# Patient Record
Sex: Female | Born: 1955 | Race: White | Hispanic: No | Marital: Married | Smoking: Former smoker
Health system: Southern US, Community
[De-identification: ages and names within clinical notes are randomized; demographics above are authoritative.]

## PROBLEM LIST (undated history)

## (undated) DIAGNOSIS — I10 Essential (primary) hypertension: Secondary | ICD-10-CM

## (undated) DIAGNOSIS — J45909 Unspecified asthma, uncomplicated: Secondary | ICD-10-CM

## (undated) DIAGNOSIS — I8393 Asymptomatic varicose veins of bilateral lower extremities: Secondary | ICD-10-CM

## (undated) DIAGNOSIS — K5900 Constipation, unspecified: Secondary | ICD-10-CM

## (undated) DIAGNOSIS — F32A Depression, unspecified: Secondary | ICD-10-CM

## (undated) DIAGNOSIS — T7840XA Allergy, unspecified, initial encounter: Secondary | ICD-10-CM

## (undated) DIAGNOSIS — I8290 Acute embolism and thrombosis of unspecified vein: Secondary | ICD-10-CM

## (undated) DIAGNOSIS — N83202 Unspecified ovarian cyst, left side: Secondary | ICD-10-CM

## (undated) DIAGNOSIS — K219 Gastro-esophageal reflux disease without esophagitis: Secondary | ICD-10-CM

## (undated) DIAGNOSIS — F419 Anxiety disorder, unspecified: Secondary | ICD-10-CM

## (undated) HISTORY — DX: Gastro-esophageal reflux disease without esophagitis: K21.9

## (undated) HISTORY — PX: TUBAL LIGATION: SHX77

## (undated) HISTORY — DX: Anxiety disorder, unspecified: F41.9

## (undated) HISTORY — DX: Allergy, unspecified, initial encounter: T78.40XA

## (undated) HISTORY — PX: AUGMENTATION MAMMAPLASTY: SUR837

## (undated) HISTORY — PX: OTHER SURGICAL HISTORY: SHX169

## (undated) HISTORY — PX: CHOLECYSTECTOMY: SHX55

## (undated) HISTORY — PX: BREAST EXCISIONAL BIOPSY: SUR124

---

## 1998-11-12 ENCOUNTER — Other Ambulatory Visit: Admission: RE | Admit: 1998-11-12 | Discharge: 1998-11-12 | Payer: Self-pay | Admitting: Obstetrics and Gynecology

## 1999-04-28 ENCOUNTER — Encounter: Payer: Self-pay | Admitting: General Surgery

## 1999-04-28 ENCOUNTER — Ambulatory Visit (HOSPITAL_BASED_OUTPATIENT_CLINIC_OR_DEPARTMENT_OTHER): Admission: RE | Admit: 1999-04-28 | Discharge: 1999-04-28 | Payer: Self-pay | Admitting: General Surgery

## 2001-04-30 ENCOUNTER — Other Ambulatory Visit: Admission: RE | Admit: 2001-04-30 | Discharge: 2001-04-30 | Payer: Self-pay | Admitting: Obstetrics and Gynecology

## 2002-05-21 ENCOUNTER — Other Ambulatory Visit: Admission: RE | Admit: 2002-05-21 | Discharge: 2002-05-21 | Payer: Self-pay | Admitting: Obstetrics and Gynecology

## 2003-05-26 ENCOUNTER — Other Ambulatory Visit: Admission: RE | Admit: 2003-05-26 | Discharge: 2003-05-26 | Payer: Self-pay | Admitting: Obstetrics and Gynecology

## 2004-06-03 ENCOUNTER — Other Ambulatory Visit: Admission: RE | Admit: 2004-06-03 | Discharge: 2004-06-03 | Payer: Self-pay | Admitting: Obstetrics and Gynecology

## 2004-10-05 ENCOUNTER — Ambulatory Visit: Payer: Self-pay | Admitting: Family Medicine

## 2004-11-02 ENCOUNTER — Ambulatory Visit: Payer: Self-pay | Admitting: Family Medicine

## 2005-08-03 ENCOUNTER — Other Ambulatory Visit: Admission: RE | Admit: 2005-08-03 | Discharge: 2005-08-03 | Payer: Self-pay | Admitting: Obstetrics and Gynecology

## 2005-08-15 ENCOUNTER — Encounter: Admission: RE | Admit: 2005-08-15 | Discharge: 2005-08-15 | Payer: Self-pay | Admitting: *Deleted

## 2005-08-22 ENCOUNTER — Ambulatory Visit: Payer: Self-pay | Admitting: Family Medicine

## 2008-02-21 ENCOUNTER — Encounter: Admission: RE | Admit: 2008-02-21 | Discharge: 2008-02-21 | Payer: Self-pay | Admitting: Obstetrics and Gynecology

## 2009-02-22 ENCOUNTER — Encounter: Admission: RE | Admit: 2009-02-22 | Discharge: 2009-02-22 | Payer: Self-pay | Admitting: Obstetrics and Gynecology

## 2010-03-02 ENCOUNTER — Encounter: Admission: RE | Admit: 2010-03-02 | Discharge: 2010-03-02 | Payer: Self-pay | Admitting: Obstetrics and Gynecology

## 2011-02-08 ENCOUNTER — Other Ambulatory Visit: Payer: Self-pay | Admitting: Obstetrics and Gynecology

## 2011-02-08 DIAGNOSIS — Z1231 Encounter for screening mammogram for malignant neoplasm of breast: Secondary | ICD-10-CM

## 2011-03-06 ENCOUNTER — Ambulatory Visit
Admission: RE | Admit: 2011-03-06 | Discharge: 2011-03-06 | Disposition: A | Discharge status: Home / Self Care | Payer: BC Managed Care – PPO | Source: Ambulatory Visit | Attending: Obstetrics and Gynecology | Admitting: Obstetrics and Gynecology

## 2011-03-06 DIAGNOSIS — Z1231 Encounter for screening mammogram for malignant neoplasm of breast: Secondary | ICD-10-CM

## 2011-05-03 ENCOUNTER — Ambulatory Visit (HOSPITAL_BASED_OUTPATIENT_CLINIC_OR_DEPARTMENT_OTHER): Payer: BC Managed Care – PPO | Attending: Family Medicine

## 2011-05-03 DIAGNOSIS — G4733 Obstructive sleep apnea (adult) (pediatric): Secondary | ICD-10-CM | POA: Insufficient documentation

## 2011-05-03 DIAGNOSIS — I498 Other specified cardiac arrhythmias: Secondary | ICD-10-CM | POA: Insufficient documentation

## 2011-05-03 DIAGNOSIS — I4949 Other premature depolarization: Secondary | ICD-10-CM | POA: Insufficient documentation

## 2011-05-06 DIAGNOSIS — I498 Other specified cardiac arrhythmias: Secondary | ICD-10-CM

## 2011-05-06 DIAGNOSIS — I4949 Other premature depolarization: Secondary | ICD-10-CM

## 2011-05-06 DIAGNOSIS — G4733 Obstructive sleep apnea (adult) (pediatric): Secondary | ICD-10-CM

## 2011-05-06 NOTE — Procedures (Signed)
Brittany Lawson, Brittany Lawson               ACCOUNT NO.:  [id number]  MEDICAL RECORD NO.:  [id number]          PATIENT TYPE:  OUT  LOCATION:  SLEEP CENTER                 FACILITY:  [facility]  PHYSICIAN:  Clinton D. Maple Hudson, MD, FCCP, FACPDATE OF BIRTH:  03/31/1955  DATE OF STUDY:  05/03/2011                           NOCTURNAL POLYSOMNOGRAM  REFERRING PHYSICIAN:  Elease Hashimoto A. Benedetto Goad, M.D.  REFERRING DOCTOR:  Gilford Rile. Shevlin, MD  INDICATION FOR STUDY:  Hypersomnia with sleep apnea.  EPWORTH SLEEPINESS SCORE:  17/24.  BMI 28.2, weight 175 pounds, height 66 inches, neck 14.5 inch.  MEDICATIONS:  Home medications are charted and reviewed.  SLEEP ARCHITECTURE:  Total sleep time 333.5 minutes with sleep efficiency 79.3%.  Stage I was 16.8%, stage II 82.2%, stage III 1%, REM absent.  Sleep latency 15.5 minutes, REM latency NA, awake during sleep onset 71.5 minutes, arousal index 37.1.  Bedtime medication: Losartan, venlafaxine, and omeprazole.  RESPIRATORY DATA:  Apnea/hypopnea index (AHI) 10.1 per hour.  A total of 56 events was scored including 1 obstructive apnea and 55 hypopneas. Most events were associated with supine sleep position.  There were insufficient numbers of early events to permit application of CPAP titration by split protocol on this study night.  OXYGEN DATA:  Mild to moderate snoring with oxygen desaturation to a nadir of 89% and a mean oxygen saturation through the study of 95.7% on room air.  No time was recorded with oxygen saturation less than 88% on room air.  CARDIAC DATA:  Frequent PVCs throughout the study including ventricular bigeminy.  MOVEMENT-PARASOMNIA:  A few limb jerks were noted with insignificant effect on sleep.  Bathroom x1.  IMPRESSIONS-RECOMMENDATIONS: 1. Sleep architecture was significant for marked fragmentation by     frequent nonspecific brief wakings throughout the night.  If this     reflects the home pattern, management as insomnia might  be     considered. 2. Mild obstructive sleep apnea/hypopnea syndrome, apnea/hypopnea     index 10.1 per hour.  Most events were associated with supine sleep     position.  Mild to moderate snoring with oxygen desaturation to a     nadir of 89% and a mean oxygen saturation of the study of 95.7% on     room air. 3. Very frequent premature ventricular contractions including     ventricular bigeminy. 4. She did not meet the requirements for application of continuous     positive airway pressure titration by split protocol on this study     night because significant numbers of respiratory events did not     occur early enough.  Therapeutic trial of CPAP would be an option     for scores in this range if clinically appropriate.  If she were to     return for CPAP titration, consider providing a sleep medication to     permit sleep consolidation during that study.     Clinton D. Maple Hudson, MD, [facility], FACP Diplomate, Biomedical engineer of Sleep Medicine Electronically Signed    CDY/MEDQ  D:  05/06/2011 10:06:50  T:  05/06/2011 10:23:34  Job:  [id number]

## 2012-02-29 ENCOUNTER — Other Ambulatory Visit: Payer: Self-pay | Admitting: Obstetrics and Gynecology

## 2012-02-29 DIAGNOSIS — Z1231 Encounter for screening mammogram for malignant neoplasm of breast: Secondary | ICD-10-CM

## 2012-03-19 ENCOUNTER — Ambulatory Visit
Admission: RE | Admit: 2012-03-19 | Discharge: 2012-03-19 | Disposition: A | Discharge status: Home / Self Care | Payer: BC Managed Care – PPO | Source: Ambulatory Visit | Attending: Obstetrics and Gynecology | Admitting: Obstetrics and Gynecology

## 2012-03-19 DIAGNOSIS — Z1231 Encounter for screening mammogram for malignant neoplasm of breast: Secondary | ICD-10-CM

## 2013-09-15 ENCOUNTER — Other Ambulatory Visit: Payer: Self-pay

## 2013-09-15 DIAGNOSIS — Z9882 Breast implant status: Secondary | ICD-10-CM

## 2013-09-15 DIAGNOSIS — Z1231 Encounter for screening mammogram for malignant neoplasm of breast: Secondary | ICD-10-CM

## 2013-09-29 ENCOUNTER — Ambulatory Visit
Admission: RE | Admit: 2013-09-29 | Discharge: 2013-09-29 | Disposition: A | Discharge status: Home / Self Care | Payer: BC Managed Care – PPO | Source: Ambulatory Visit

## 2013-09-29 DIAGNOSIS — Z1231 Encounter for screening mammogram for malignant neoplasm of breast: Secondary | ICD-10-CM

## 2013-09-29 DIAGNOSIS — Z9882 Breast implant status: Secondary | ICD-10-CM

## 2014-10-13 ENCOUNTER — Other Ambulatory Visit: Payer: Self-pay

## 2014-10-13 DIAGNOSIS — Z1231 Encounter for screening mammogram for malignant neoplasm of breast: Secondary | ICD-10-CM

## 2014-10-23 ENCOUNTER — Ambulatory Visit
Admission: RE | Admit: 2014-10-23 | Discharge: 2014-10-23 | Disposition: A | Discharge status: Home / Self Care | Payer: 59 | Source: Ambulatory Visit

## 2014-10-23 DIAGNOSIS — Z1231 Encounter for screening mammogram for malignant neoplasm of breast: Secondary | ICD-10-CM

## 2015-12-15 ENCOUNTER — Other Ambulatory Visit: Payer: Self-pay

## 2015-12-15 DIAGNOSIS — Z1231 Encounter for screening mammogram for malignant neoplasm of breast: Secondary | ICD-10-CM

## 2016-01-03 ENCOUNTER — Ambulatory Visit
Admission: RE | Admit: 2016-01-03 | Discharge: 2016-01-03 | Disposition: A | Discharge status: Home / Self Care | Payer: BLUE CROSS/BLUE SHIELD | Source: Ambulatory Visit

## 2016-01-03 DIAGNOSIS — Z1231 Encounter for screening mammogram for malignant neoplasm of breast: Secondary | ICD-10-CM

## 2017-01-10 ENCOUNTER — Other Ambulatory Visit: Payer: Self-pay | Admitting: Obstetrics & Gynecology

## 2017-01-10 DIAGNOSIS — Z1231 Encounter for screening mammogram for malignant neoplasm of breast: Secondary | ICD-10-CM

## 2017-01-19 ENCOUNTER — Other Ambulatory Visit: Payer: Self-pay | Admitting: Obstetrics & Gynecology

## 2017-01-19 ENCOUNTER — Ambulatory Visit
Admission: RE | Admit: 2017-01-19 | Discharge: 2017-01-19 | Disposition: A | Discharge status: Home / Self Care | Payer: BLUE CROSS/BLUE SHIELD | Source: Ambulatory Visit | Attending: Obstetrics & Gynecology | Admitting: Obstetrics & Gynecology

## 2017-01-19 DIAGNOSIS — Z1231 Encounter for screening mammogram for malignant neoplasm of breast: Secondary | ICD-10-CM

## 2017-07-17 DIAGNOSIS — K635 Polyp of colon: Secondary | ICD-10-CM | POA: Insufficient documentation

## 2017-07-17 HISTORY — DX: Polyp of colon: K63.5

## 2018-01-22 ENCOUNTER — Other Ambulatory Visit: Payer: Self-pay | Admitting: Obstetrics & Gynecology

## 2018-01-22 DIAGNOSIS — Z1231 Encounter for screening mammogram for malignant neoplasm of breast: Secondary | ICD-10-CM

## 2018-02-18 ENCOUNTER — Ambulatory Visit
Admission: RE | Admit: 2018-02-18 | Discharge: 2018-02-18 | Disposition: A | Discharge status: Home / Self Care | Payer: BLUE CROSS/BLUE SHIELD | Source: Ambulatory Visit | Attending: Obstetrics & Gynecology | Admitting: Obstetrics & Gynecology

## 2018-02-18 DIAGNOSIS — Z1231 Encounter for screening mammogram for malignant neoplasm of breast: Secondary | ICD-10-CM

## 2019-08-20 ENCOUNTER — Ambulatory Visit (INDEPENDENT_AMBULATORY_CARE_PROVIDER_SITE_OTHER): Payer: BLUE CROSS/BLUE SHIELD | Admitting: Physician Assistant

## 2019-08-20 ENCOUNTER — Encounter: Payer: Self-pay | Admitting: Physician Assistant

## 2019-08-20 ENCOUNTER — Other Ambulatory Visit: Payer: Self-pay

## 2019-08-20 DIAGNOSIS — F419 Anxiety disorder, unspecified: Secondary | ICD-10-CM | POA: Diagnosis not present

## 2019-08-20 DIAGNOSIS — J060 Acute laryngopharyngitis: Secondary | ICD-10-CM

## 2019-08-20 DIAGNOSIS — K219 Gastro-esophageal reflux disease without esophagitis: Secondary | ICD-10-CM | POA: Insufficient documentation

## 2019-08-20 HISTORY — DX: Acute laryngopharyngitis: J06.0

## 2019-08-20 HISTORY — DX: Gastro-esophageal reflux disease without esophagitis: K21.9

## 2019-08-20 MED ORDER — AZITHROMYCIN 250 MG PO TABS: ORAL_TABLET | ORAL | 0 refills | Status: DC

## 2019-08-20 NOTE — Progress Notes (Signed)
Acute Office Visit  Subjective:    Patient ID: Brittany Lawson, female    DOB: 1956/04/08, 64 y.o.   MRN: [id number]  Chief Complaint  Patient presents with  . URI    HPI Patient is in today for uri - Pt states that for over the past 2 weeks she has had sore throat, cough and had a fever up until a week ago - She has tested negative twice for COVID - she was called in Augmentin which has helped significantly - now she has a minimal sore throat and slight cough Denies fever, achiness, headache, nausea or vomiting  Past Medical History:  Diagnosis Date  . Anxiety   . GERD (gastroesophageal reflux disease)     Past Surgical History:  Procedure Laterality Date  . AUGMENTATION MAMMAPLASTY Bilateral   . BREAST EXCISIONAL BIOPSY Right    APPROX 20 YEARS AGO    Family History  Problem Relation Age of Onset  . Breast cancer Mother 25    Social History   Socioeconomic History  . Marital status: Married    Spouse name: Not on file  . Number of children: 4  . Years of education: Not on file  . Highest education level: Not on file  Occupational History  . Occupation: homemaker  Tobacco Use  . Smoking status: Former Research scientist (life sciences)  . Smokeless tobacco: Never Used  Substance and Sexual Activity  . Alcohol use: Not Currently    Comment: past smoker quit 20 years ago  . Drug use: Not on file  . Sexual activity: Not on file  Other Topics Concern  . Not on file  Social History Narrative  . Not on file   Social Determinants of Health   Financial Resource Strain:   . Difficulty of Paying Living Expenses: Not on file  Food Insecurity:   . Worried About Charity fundraiser in the Last Year: Not on file  . Ran Out of Food in the Last Year: Not on file  Transportation Needs:   . Lack of Transportation (Medical): Not on file  . Lack of Transportation (Non-Medical): Not on file  Physical Activity:   . Days of Exercise per Week: Not on file  . Minutes of Exercise per Session: Not on  file  Stress:   . Feeling of Stress : Not on file  Social Connections:   . Frequency of Communication with Friends and Family: Not on file  . Frequency of Social Gatherings with Friends and Family: Not on file  . Attends Religious Services: Not on file  . Active Member of Clubs or Organizations: Not on file  . Attends Archivist Meetings: Not on file  . Marital Status: Not on file  Intimate Partner Violence:   . Fear of Current or Ex-Partner: Not on file  . Emotionally Abused: Not on file  . Physically Abused: Not on file  . Sexually Abused: Not on file    Outpatient Medications Prior to Visit  Medication Sig Dispense Refill  . amLODipine (NORVASC) 5 MG tablet Take 5 mg by mouth daily.    Marland Kitchen losartan (COZAAR) 100 MG tablet Take 100 mg by mouth daily.    . meclizine (ANTIVERT) 25 MG tablet Take 25 mg by mouth 3 (three) times daily as needed.    . montelukast (SINGULAIR) 10 MG tablet Take 10 mg by mouth at bedtime.    . pantoprazole (PROTONIX) 40 MG tablet Take 40 mg by mouth daily.    Marland Kitchen  traZODone (DESYREL) 50 MG tablet Take 50 mg by mouth at bedtime.    Marland Kitchen venlafaxine (EFFEXOR) 75 MG tablet Take 75 mg by mouth 2 (two) times daily.    Marland Kitchen albuterol (PROAIR HFA) 108 (90 Base) MCG/ACT inhaler ProAir HFA 90 mcg/actuation aerosol inhaler     No facility-administered medications prior to visit.    Allergies  Allergen Reactions  . Sulfa Antibiotics     CONSTITUTIONAL: Negative for chills, fatigue, fever, unintentional weight gain and unintentional weight loss.  E/N/T:see HPI   CARDIOVASCULAR: Negative for chest pain, dizziness, palpitations and pedal edema.  RESPIRATORY: Negative for recent cough and dyspnea.  GASTROINTESTINAL: Negative for abdominal pain, acid reflux symptoms, constipation, diarrhea, nausea and vomiting.  MSK: Negative for arthralgias and myalgias.  INTEGUMENTARY: Negative for rash.  NEUROLOGICAL: Negative for dizziness and headaches.  PSYCHIATRIC: Negative  for sleep disturbance and to question depression screen.  Negative for depression, negative for anhedonia.         Objective:    PHYSICAL EXAM:   VS: BP 128/70   Pulse 74   Temp 97.6 F (36.4 C)   Resp 16   Ht 5' 5.5" (1.664 m)   Wt 180 lb (81.6 kg)   SpO2 97%   BMI 29.50 kg/m   GEN: Well nourished, well developed, in no acute distress  HEENT: normal external ears and nose - normal external auditory canals and TMS - hearing grossly normal - normal nasal mucosa and septum - Lips, Teeth and Gums - mildly erythematous throat with PND  Oropharynx - normal mucosa, palate, and posterior pharynx  Cardiac: RRR; no murmurs, rubs, or gallops,no edema -Respiratory:  normal respiratory rate and pattern with no distress - normal breath sounds with no rales, rhonchi, wheezes or rubs  Skin: warm and dry, no rash     Wt Readings from Last 3 Encounters:  08/20/19 180 lb (81.6 kg)    Health Maintenance Due  Topic Date Due  . Hepatitis C Screening  16-Oct-1955  . HIV Screening  11/10/1969  . TETANUS/TDAP  11/10/1973  . PAP SMEAR-Modifier  11/11/1975  . COLONOSCOPY  11/10/2004  . INFLUENZA VACCINE  02/15/2019    There are no preventive care reminders to display for this patient.   No results found for: TSH No results found for: WBC, HGB, HCT, MCV, PLT No results found for: NA, K, CHLORIDE, CO2, GLUCOSE, BUN, CREATININE, BILITOT, ALKPHOS, AST, ALT, PROT, ALBUMIN, CALCIUM, ANIONGAP, EGFR, GFR No results found for: CHOL No results found for: HDL No results found for: LDLCALC No results found for: TRIG No results found for: CHOLHDL No results found for: HGBA1C     Assessment & Plan:   Problem List Items Addressed This Visit      Respiratory   Acute laryngopharyngitis    Recommend rest and fluids Stop augmentin and rx for zpack sent in  Follow up if any symptoms change or worsen      Relevant Medications   azithromycin (ZITHROMAX) 250 MG tablet     Other   Anxiety    Relevant Medications   traZODone (DESYREL) 50 MG tablet   venlafaxine (EFFEXOR) 75 MG tablet       Meds ordered this encounter  Medications  . azithromycin (ZITHROMAX) 250 MG tablet    Sig: 2 po day one 1 po days 2-5    Dispense:  6 tablet    Refill:  0    Order Specific Question:   Supervising Provider    Answer:  COX, KIRSTEN [[id number]]     SARA R Matai Carpenito, PA-C

## 2019-08-20 NOTE — Assessment & Plan Note (Signed)
Recommend rest and fluids Stop augmentin and rx for zpack sent in  Follow up if any symptoms change or worsen

## 2019-09-01 ENCOUNTER — Other Ambulatory Visit: Payer: Self-pay | Admitting: Physician Assistant

## 2019-11-03 ENCOUNTER — Other Ambulatory Visit: Payer: Self-pay | Admitting: Physician Assistant

## 2019-11-12 DIAGNOSIS — Z86010 Personal history of colonic polyps: Secondary | ICD-10-CM | POA: Insufficient documentation

## 2019-11-12 DIAGNOSIS — Z860101 Personal history of adenomatous and serrated colon polyps: Secondary | ICD-10-CM

## 2019-11-12 HISTORY — DX: Personal history of adenomatous and serrated colon polyps: Z86.0101

## 2019-11-21 ENCOUNTER — Other Ambulatory Visit: Payer: Self-pay | Admitting: Obstetrics & Gynecology

## 2019-11-21 DIAGNOSIS — Z1231 Encounter for screening mammogram for malignant neoplasm of breast: Secondary | ICD-10-CM

## 2019-11-28 ENCOUNTER — Ambulatory Visit: Payer: BLUE CROSS/BLUE SHIELD

## 2019-12-01 ENCOUNTER — Other Ambulatory Visit: Payer: Self-pay | Admitting: Physician Assistant

## 2019-12-02 ENCOUNTER — Other Ambulatory Visit: Payer: Self-pay

## 2019-12-02 MED ORDER — VENLAFAXINE HCL 75 MG PO TABS: 75.0000 mg | ORAL_TABLET | Freq: Two times a day (BID) | ORAL | 0 refills | Status: DC

## 2019-12-03 ENCOUNTER — Ambulatory Visit
Admission: RE | Admit: 2019-12-03 | Discharge: 2019-12-03 | Disposition: A | Discharge status: Home / Self Care | Payer: Medicare Other | Source: Ambulatory Visit | Attending: Obstetrics & Gynecology | Admitting: Obstetrics & Gynecology

## 2019-12-03 ENCOUNTER — Other Ambulatory Visit: Payer: Self-pay

## 2019-12-03 DIAGNOSIS — Z1231 Encounter for screening mammogram for malignant neoplasm of breast: Secondary | ICD-10-CM

## 2019-12-08 ENCOUNTER — Other Ambulatory Visit: Payer: Self-pay

## 2019-12-08 ENCOUNTER — Encounter: Payer: Self-pay | Admitting: Physician Assistant

## 2019-12-08 ENCOUNTER — Ambulatory Visit (INDEPENDENT_AMBULATORY_CARE_PROVIDER_SITE_OTHER): Payer: Medicare Other | Admitting: Physician Assistant

## 2019-12-08 VITALS — BP 128/68 | HR 82 | Temp 98.1°F | Ht 65.5 in | Wt 176.0 lb

## 2019-12-08 DIAGNOSIS — I10 Essential (primary) hypertension: Secondary | ICD-10-CM

## 2019-12-08 DIAGNOSIS — F419 Anxiety disorder, unspecified: Secondary | ICD-10-CM | POA: Diagnosis not present

## 2019-12-08 HISTORY — DX: Essential (primary) hypertension: I10

## 2019-12-08 MED ORDER — ALPRAZOLAM 0.5 MG PO TABS: ORAL_TABLET | ORAL | 1 refills | Status: DC

## 2019-12-08 NOTE — Assessment & Plan Note (Addendum)
Continue current meds and refill given of xanax TSH pending

## 2019-12-08 NOTE — Assessment & Plan Note (Signed)
Well controlled.  ?No changes to medicines.  ?Continue to work on eating a healthy diet and exercise.  ?Labs drawn today.  ?

## 2019-12-08 NOTE — Progress Notes (Signed)
Acute Office Visit  Subjective:    Patient ID: Brittany Lawson, female    DOB: 20-Nov-1955, 64 y.o.   MRN: [id number]  Chief Complaint  Patient presents with  . Hypertension anxiety    HPI Patient is in today for hypertension  Pt presents for follow up of hypertension. . The patient is tolerating the medication well without side effects. Compliance with treatment has been good; including taking medication as directed , maintains a healthy diet and regular exercise regimen , and following up as directed.pt is currently on norvasc and cozaar - is due for labwork  Pt has history of anxiety - is currently on desyrel and effexor which she says works most of the time well for her however has had some family stressors and is having some breakthrough anxiety symptoms - she has had xanax in the past and requests refill of medication  Past Medical History:  Diagnosis Date  . Anxiety   . GERD (gastroesophageal reflux disease)     Past Surgical History:  Procedure Laterality Date  . AUGMENTATION MAMMAPLASTY Bilateral   . BREAST EXCISIONAL BIOPSY Right    APPROX 20 YEARS AGO    Family History  Problem Relation Age of Onset  . Breast cancer Mother 60    Social History   Socioeconomic History  . Marital status: Married    Spouse name: Not on file  . Number of children: 4  . Years of education: Not on file  . Highest education level: Not on file  Occupational History  . Occupation: homemaker  Tobacco Use  . Smoking status: Former Research scientist (life sciences)  . Smokeless tobacco: Never Used  Substance and Sexual Activity  . Alcohol use: Not Currently    Comment: past smoker quit 20 years ago  . Drug use: Not on file  . Sexual activity: Not on file  Other Topics Concern  . Not on file  Social History Narrative  . Not on file   Social Determinants of Health   Financial Resource Strain:   . Difficulty of Paying Living Expenses:   Food Insecurity:   . Worried About Charity fundraiser in the  Last Year:   . Arboriculturist in the Last Year:   Transportation Needs:   . Film/video editor (Medical):   Marland Kitchen Lack of Transportation (Non-Medical):   Physical Activity:   . Days of Exercise per Week:   . Minutes of Exercise per Session:   Stress:   . Feeling of Stress :   Social Connections:   . Frequency of Communication with Friends and Family:   . Frequency of Social Gatherings with Friends and Family:   . Attends Religious Services:   . Active Member of Clubs or Organizations:   . Attends Archivist Meetings:   Marland Kitchen Marital Status:   Intimate Partner Violence:   . Fear of Current or Ex-Partner:   . Emotionally Abused:   Marland Kitchen Physically Abused:   . Sexually Abused:      Current Outpatient Medications:  .  albuterol (PROAIR HFA) 108 (90 Base) MCG/ACT inhaler, ProAir HFA 90 mcg/actuation aerosol inhaler, Disp: , Rfl:  .  ALPRAZolam (XANAX) 0.5 MG tablet, 1 po qd prn anxiety, Disp: 30 tablet, Rfl: 1 .  amLODipine (NORVASC) 5 MG tablet, TAKE 1 TABLET BY MOUTH DAILY, Disp: 90 tablet, Rfl: 0 .  etodolac (LODINE) 400 MG tablet, Take 400 mg by mouth 2 (two) times daily., Disp: , Rfl:  .  losartan (  COZAAR) 100 MG tablet, TAKE 1 TABLET BY MOUTH DAILY, Disp: 90 tablet, Rfl: 0 .  meclizine (ANTIVERT) 25 MG tablet, Take 25 mg by mouth 3 (three) times daily as needed., Disp: , Rfl:  .  montelukast (SINGULAIR) 10 MG tablet, TAKE 1 TABLET BY MOUTH DAILY, Disp: 90 tablet, Rfl: 0 .  pantoprazole (PROTONIX) 40 MG tablet, TAKE 1 TABLET BY MOUTH TWICE A DAY, Disp: 180 tablet, Rfl: 0 .  SYMBICORT 160-4.5 MCG/ACT inhaler, SMARTSIG:2 Puff(s) Via Inhaler Morning-Night, Disp: , Rfl:  .  traZODone (DESYREL) 50 MG tablet, TAKE 1 TABLET BY MOUTH AT BEDTIME, Disp: 90 tablet, Rfl: 0 .  venlafaxine XR (EFFEXOR-XR) 75 MG 24 hr capsule, TAKE 1 CAPSULE BY MOUTH TWICE DAILY WITH FOOD, Disp: 180 capsule, Rfl: 0   Allergies  Allergen Reactions  . Sulfa Antibiotics Nausea Only and Other (See Comments)      CONSTITUTIONAL: Negative for chills, fatigue, fever, unintentional weight gain and unintentional weight loss.  CARDIOVASCULAR: Negative for chest pain, dizziness, palpitations and pedal edema.  RESPIRATORY: Negative for recent cough and dyspnea.   NEUROLOGICAL: Negative for dizziness and headaches.  PSYCHIATRIC: see HPI        Objective:    PHYSICAL EXAM:   VS: BP 128/68 (BP Location: Left Arm, Patient Position: Sitting)   Pulse 82   Temp 98.1 F (36.7 C) (Temporal)   Ht 5' 5.5" (1.664 m)   Wt 176 lb (79.8 kg)   SpO2 97%   BMI 28.84 kg/m   GEN: Well nourished, well developed, in no acute distress   Cardiac: RRR; no murmurs, rubs, or gallops,no edema - no significant varicosities Respiratory:  normal respiratory rate and pattern with no distress - normal breath sounds with no rales, rhonchi, wheezes or rubs  Neuro:  Alert and Oriented x 3, Strength and sensation are intact - CN II-Xii grossly intact Psych: tearful -    Wt Readings from Last 3 Encounters:  12/08/19 176 lb (79.8 kg)  08/20/19 180 lb (81.6 kg)    Health Maintenance Due  Topic Date Due  . Hepatitis C Screening  Never done  . COVID-19 Vaccine (1) Never done  . HIV Screening  Never done  . TETANUS/TDAP  Never done  . PAP SMEAR-Modifier  Never done  . COLONOSCOPY  Never done  . DEXA SCAN  Never done  . PNA vac Low Risk Adult (1 of 2 - PCV13) 11/11/2019    There are no preventive care reminders to display for this patient.        Assessment & Plan:   Problem List Items Addressed This Visit      Cardiovascular and Mediastinum   Essential hypertension - Primary    Well controlled.  No changes to medicines.  Continue to work on eating a healthy diet and exercise.  Labs drawn today.        Relevant Orders   CBC with Differential/Platelet   Comprehensive metabolic panel     Other   Anxiety    Continue current meds and refill given of xanax TSH pending      Relevant Medications    ALPRAZolam (XANAX) 0.5 MG tablet   Other Relevant Orders   TSH       Meds ordered this encounter  Medications  . ALPRAZolam (XANAX) 0.5 MG tablet    Sig: 1 po qd prn anxiety    Dispense:  30 tablet    Refill:  1    Order Specific Question:  Supervising Provider    Answer:   Rochel Brome [[id number]]     Brady, PA-C

## 2019-12-09 LAB — CBC WITH DIFFERENTIAL/PLATELET
Basophils Absolute: 0.1 10*3/uL (ref 0.0–0.2)
Basos: 1 %
EOS (ABSOLUTE): 0.3 10*3/uL (ref 0.0–0.4)
Eos: 3 %
Hematocrit: 37.8 % (ref 34.0–46.6)
Hemoglobin: 12.8 g/dL (ref 11.1–15.9)
Immature Grans (Abs): 0 10*3/uL (ref 0.0–0.1)
Immature Granulocytes: 0 %
Lymphocytes Absolute: 2.4 10*3/uL (ref 0.7–3.1)
Lymphs: 27 %
MCH: 26.8 pg (ref 26.6–33.0)
MCHC: 33.9 g/dL (ref 31.5–35.7)
MCV: 79 fL (ref 79–97)
Monocytes Absolute: 0.7 10*3/uL (ref 0.1–0.9)
Monocytes: 8 %
Neutrophils Absolute: 5.5 10*3/uL (ref 1.4–7.0)
Neutrophils: 61 %
Platelets: 281 10*3/uL (ref 150–450)
RBC: 4.78 x10E6/uL (ref 3.77–5.28)
RDW: 13 % (ref 11.7–15.4)
WBC: 9 10*3/uL (ref 3.4–10.8)

## 2019-12-09 LAB — COMPREHENSIVE METABOLIC PANEL
ALT: 13 IU/L (ref 0–32)
AST: 20 IU/L (ref 0–40)
Albumin/Globulin Ratio: 1.3 (ref 1.2–2.2)
Albumin: 4.1 g/dL (ref 3.8–4.8)
Alkaline Phosphatase: 74 IU/L (ref 48–121)
BUN/Creatinine Ratio: 16 (ref 12–28)
BUN: 17 mg/dL (ref 8–27)
Bilirubin Total: <0.2 mg/dL (ref 0.0–1.2)
CO2: 22 mmol/L (ref 20–29)
Calcium: 9.2 mg/dL (ref 8.7–10.3)
Chloride: 105 mmol/L (ref 96–106)
Creatinine, Ser: 1.06 mg/dL — ABNORMAL HIGH (ref 0.57–1.00)
GFR calc Af Amer: 64 mL/min/{1.73_m2} (ref >59)
GFR calc non Af Amer: 55 mL/min/{1.73_m2} — ABNORMAL LOW (ref >59)
Globulin, Total: 3.1 g/dL (ref 1.5–4.5)
Glucose: 110 mg/dL — ABNORMAL HIGH (ref 65–99)
Potassium: 4.1 mmol/L (ref 3.5–5.2)
Sodium: 140 mmol/L (ref 134–144)
Total Protein: 7.2 g/dL (ref 6.0–8.5)

## 2019-12-09 LAB — TSH: TSH: 1.52 u[IU]/mL (ref 0.450–4.500)

## 2020-02-02 ENCOUNTER — Other Ambulatory Visit: Payer: Self-pay | Admitting: Family Medicine

## 2020-03-16 ENCOUNTER — Other Ambulatory Visit: Payer: Self-pay | Admitting: Physician Assistant

## 2020-03-17 ENCOUNTER — Other Ambulatory Visit: Payer: Self-pay | Admitting: Physician Assistant

## 2020-03-17 DIAGNOSIS — Z87442 Personal history of urinary calculi: Secondary | ICD-10-CM | POA: Insufficient documentation

## 2020-03-17 HISTORY — DX: Personal history of urinary calculi: Z87.442

## 2020-03-17 MED ORDER — FLUTICASONE-SALMETEROL 250-50 MCG/DOSE IN AEPB: 1.0000 | INHALATION_SPRAY | Freq: Two times a day (BID) | RESPIRATORY_TRACT | 5 refills | Status: DC

## 2020-05-03 ENCOUNTER — Other Ambulatory Visit: Payer: Self-pay | Admitting: Physician Assistant

## 2020-05-03 ENCOUNTER — Other Ambulatory Visit: Payer: Self-pay | Admitting: Family Medicine

## 2020-05-17 HISTORY — PX: UPPER GI ENDOSCOPY: SHX6162

## 2020-08-04 ENCOUNTER — Other Ambulatory Visit: Payer: Self-pay | Admitting: Obstetrics & Gynecology

## 2020-08-04 DIAGNOSIS — N83209 Unspecified ovarian cyst, unspecified side: Secondary | ICD-10-CM

## 2020-08-12 ENCOUNTER — Other Ambulatory Visit: Payer: Self-pay | Admitting: Family Medicine

## 2020-08-18 ENCOUNTER — Other Ambulatory Visit: Payer: BLUE CROSS/BLUE SHIELD

## 2020-08-20 ENCOUNTER — Ambulatory Visit
Admission: RE | Admit: 2020-08-20 | Discharge: 2020-08-20 | Disposition: A | Discharge status: Home / Self Care | Payer: Medicare Other | Source: Ambulatory Visit | Attending: Obstetrics & Gynecology | Admitting: Obstetrics & Gynecology

## 2020-08-20 DIAGNOSIS — N83209 Unspecified ovarian cyst, unspecified side: Secondary | ICD-10-CM

## 2020-08-23 ENCOUNTER — Telehealth: Payer: Self-pay | Admitting: *Deleted

## 2020-08-23 ENCOUNTER — Other Ambulatory Visit: Payer: Self-pay

## 2020-08-23 ENCOUNTER — Ambulatory Visit
Admission: RE | Admit: 2020-08-23 | Discharge: 2020-08-23 | Disposition: A | Discharge status: Home / Self Care | Payer: Self-pay | Source: Ambulatory Visit | Attending: Gynecologic Oncology | Admitting: Gynecologic Oncology

## 2020-08-23 DIAGNOSIS — R109 Unspecified abdominal pain: Secondary | ICD-10-CM

## 2020-08-23 NOTE — Telephone Encounter (Addendum)
Called and scheduled the patient for a new patient appt with Dr Denman George for 2/10 at [facility]. Patient given the address and phone number for the clinic. Patient also given the policy for mask and visitors

## 2020-08-25 ENCOUNTER — Encounter: Payer: Self-pay | Admitting: Gynecologic Oncology

## 2020-08-26 ENCOUNTER — Other Ambulatory Visit: Payer: Self-pay

## 2020-08-26 ENCOUNTER — Encounter: Payer: Self-pay | Admitting: Gynecologic Oncology

## 2020-08-26 ENCOUNTER — Other Ambulatory Visit: Payer: Self-pay | Admitting: Gynecologic Oncology

## 2020-08-26 ENCOUNTER — Inpatient Hospital Stay: Payer: Medicare Other

## 2020-08-26 ENCOUNTER — Inpatient Hospital Stay: Payer: Medicare Other | Attending: Gynecologic Oncology | Admitting: Gynecologic Oncology

## 2020-08-26 VITALS — BP 151/82 | HR 102 | Temp 98.8°F | Resp 18 | Ht 65.0 in | Wt 178.6 lb

## 2020-08-26 DIAGNOSIS — Z1273 Encounter for screening for malignant neoplasm of ovary: Secondary | ICD-10-CM | POA: Insufficient documentation

## 2020-08-26 DIAGNOSIS — R9389 Abnormal findings on diagnostic imaging of other specified body structures: Secondary | ICD-10-CM | POA: Diagnosis not present

## 2020-08-26 DIAGNOSIS — R1904 Left lower quadrant abdominal swelling, mass and lump: Secondary | ICD-10-CM

## 2020-08-26 DIAGNOSIS — N83202 Unspecified ovarian cyst, left side: Secondary | ICD-10-CM | POA: Diagnosis present

## 2020-08-26 DIAGNOSIS — N9489 Other specified conditions associated with female genital organs and menstrual cycle: Secondary | ICD-10-CM

## 2020-08-26 DIAGNOSIS — Z79899 Other long term (current) drug therapy: Secondary | ICD-10-CM | POA: Insufficient documentation

## 2020-08-26 DIAGNOSIS — K219 Gastro-esophageal reflux disease without esophagitis: Secondary | ICD-10-CM | POA: Insufficient documentation

## 2020-08-26 DIAGNOSIS — F32A Depression, unspecified: Secondary | ICD-10-CM | POA: Insufficient documentation

## 2020-08-26 DIAGNOSIS — Z87891 Personal history of nicotine dependence: Secondary | ICD-10-CM | POA: Diagnosis not present

## 2020-08-26 DIAGNOSIS — I10 Essential (primary) hypertension: Secondary | ICD-10-CM | POA: Diagnosis not present

## 2020-08-26 DIAGNOSIS — F419 Anxiety disorder, unspecified: Secondary | ICD-10-CM | POA: Diagnosis not present

## 2020-08-26 LAB — CEA (IN HOUSE-CHCC): CEA (CHCC-In House): 2.42 ng/mL (ref 0.00–5.00)

## 2020-08-26 MED ORDER — IBUPROFEN 800 MG PO TABS: 800.0000 mg | ORAL_TABLET | Freq: Three times a day (TID) | ORAL | 0 refills | Status: DC | PRN

## 2020-08-26 MED ORDER — SENNOSIDES-DOCUSATE SODIUM 8.6-50 MG PO TABS: 2.0000 | ORAL_TABLET | Freq: Every day | ORAL | 0 refills | Status: DC

## 2020-08-26 MED ORDER — TRAMADOL HCL 50 MG PO TABS: 50.0000 mg | ORAL_TABLET | Freq: Four times a day (QID) | ORAL | 0 refills | Status: DC | PRN

## 2020-08-26 NOTE — Patient Instructions (Addendum)
We will check lab work today (tumor markers) and contact you with the results.  Preparing for your Surgery  Plan for surgery on September 15, 2020 with Dr. Everitt Amber at [facility] [place]. You will be scheduled for a robotic assisted total laparoscopic hysterectomy (removal of the uterus and cervix), bilateral salpingo-oophorectomy (removal of both ovaries and fallopian tubes), possible staging if a cancer is identified.   Pre-operative Testing -You will receive a phone call from presurgical testing at [facility] to arrange for a pre-operative appointment, labs, and COVID test. The COVID test normally happens 3 days prior to the surgery and they ask that you self quarantine after the test up until surgery to decrease chance of exposure.  -Bring your insurance card, copy of an advanced directive if applicable, medication list  -At that visit, you will be asked to sign a consent for a possible blood transfusion in case a transfusion becomes necessary during surgery.  The need for a blood transfusion is rare but having consent is a necessary part of your care.     -You should not be taking blood thinners or aspirin at least ten days prior to surgery unless instructed by your surgeon.  -Do not take supplements such as fish oil (omega 3), red yeast rice, turmeric before your surgery. You want to avoid medications with aspirin in them including headache powders such as BC or Goody's), Excedrin migraine.  Day Before Surgery at [facility] will be asked to take in a light diet the day before surgery. You will be advised you can have clear liquids up until 3 hours before your surgery.    Eat a light diet the day before surgery.  Examples including soups, broths, toast, yogurt, mashed potatoes.  AVOID GAS PRODUCING FOODS. Things to avoid include carbonated beverages (fizzy beverages, sodas), raw fruits and raw vegetables (uncooked), or beans.   If your bowels are filled with  gas, your surgeon will have difficulty visualizing your pelvic organs which increases your surgical risks.  Your role in recovery Your role is to become active as soon as directed by your doctor, while still giving yourself time to heal.  Rest when you feel tired. You will be asked to do the following in order to speed your recovery:  - Cough and breathe deeply. This helps to clear and expand your lungs and can prevent pneumonia after surgery.  - [place]. Do mild physical activity. Walking or moving your legs help your circulation and body functions return to normal. Do not try to get up or walk alone the first time after surgery.   -If you develop swelling on one leg or the other, pain in the back of your leg, redness/warmth in one of your legs, please call the office or go to the Emergency Room to have a doppler to rule out a blood clot. For shortness of breath, chest pain-seek care in the Emergency Room as soon as possible. - Actively manage your pain. Managing your pain lets you move in comfort. We will ask you to rate your pain on a scale of zero to 10. It is your responsibility to tell your doctor or nurse where and how much you hurt so your pain can be treated.  Special Considerations -If you are diabetic, you may be placed on insulin after surgery to have closer control over your blood sugars to promote healing and recovery.  This does not mean that you will  be discharged on insulin.  If applicable, your oral antidiabetics will be resumed when you are tolerating a solid diet.  -Your final pathology results from surgery should be available around one week after surgery and the results will be relayed to you when available.  -FMLA forms can be faxed to [phone] and please allow 5-7 business days for completion.  Pain Management After Surgery -You have been prescribed your pain medication and bowel regimen medications before surgery so that you can have these  available when you are discharged from the hospital. The pain medication is for use ONLY AFTER surgery and a new prescription will not be given.   -Make sure that you have Tylenol and Ibuprofen at home to use on a regular basis after surgery for pain control. We recommend alternating the medications every hour to six hours since they work differently and are processed in the body differently for pain relief.  -Review the attached handout on narcotic use and their risks and side effects.   Bowel Regimen -You have been prescribed Sennakot-S to take nightly to prevent constipation especially if you are taking the narcotic pain medication intermittently.  It is important to prevent constipation and drink adequate amounts of liquids. You can stop taking this medication when you are not taking pain medication and you are back on your normal bowel routine.  Risks of Surgery Risks of surgery are low but include bleeding, infection, damage to surrounding structures, re-operation, blood clots, and very rarely death.   Blood Transfusion Information (For the consent to be signed before surgery)  We will be checking your blood type before surgery so in case of emergencies, we will know what type of blood you would need.                                            WHAT IS A BLOOD TRANSFUSION?  A transfusion is the replacement of blood or some of its parts. Blood is made up of multiple cells which provide different functions.  Red blood cells carry oxygen and are used for blood loss replacement.  White blood cells fight against infection.  Platelets control bleeding.  Plasma helps clot blood.  Other blood products are available for specialized needs, such as hemophilia or other clotting disorders. BEFORE THE TRANSFUSION  Who gives blood for transfusions?   You may be able to donate blood to be used at a later date on yourself (autologous donation).  Relatives can be asked to donate blood. This is  generally not any safer than if you have received blood from a stranger. The same precautions are taken to ensure safety when a relative's blood is donated.  Healthy volunteers who are fully evaluated to make sure their blood is safe. This is blood bank blood. Transfusion therapy is the safest it has ever been in the practice of medicine. Before blood is taken from a donor, a complete history is taken to make sure that person has no history of diseases nor engages in risky social behavior (examples are intravenous drug use or sexual activity with multiple partners). The donor's travel history is screened to minimize risk of transmitting infections, such as malaria. The donated blood is tested for signs of infectious diseases, such as HIV and hepatitis. The blood is then tested to be sure it is compatible with you in order to minimize the chance of  a transfusion reaction. If you or a relative donates blood, this is often done in anticipation of surgery and is not appropriate for emergency situations. It takes many days to process the donated blood. RISKS AND COMPLICATIONS Although transfusion therapy is very safe and saves many lives, the main dangers of transfusion include:   Getting an infectious disease.  Developing a transfusion reaction. This is an allergic reaction to something in the blood you were given. Every precaution is taken to prevent this. The decision to have a blood transfusion has been considered carefully by your caregiver before blood is given. Blood is not given unless the benefits outweigh the risks.  AFTER SURGERY INSTRUCTIONS  Return to work: 4 weeks if applicable  Activity: 1. Be up and out of the bed during the day.  Take a nap if needed.  You may walk up steps but be careful and use the hand rail.  Stair climbing will tire you more than you think, you may need to stop part way and rest.   2. No lifting or straining for 6 weeks over 10 pounds. No pushing, pulling,  straining for 6 weeks.  3. No driving for around 1 week(s).  Do not drive if you are taking narcotic pain medicine and make sure that your reaction time has returned.   4. You can shower as soon as the next day after surgery. Shower daily.  Use your regular soap and water (not directly on the incision) and pat your incision(s) dry afterwards; don't rub.  No tub baths or submerging your body in water until cleared by your surgeon. If you have the soap that was given to you by pre-surgical testing that was used before surgery, you do not need to use it afterwards because this can irritate your incisions.   5. No sexual activity and nothing in the vagina for 8 weeks.  6. You may experience a small amount of clear drainage from your incisions, which is normal.  If the drainage persists, increases, or changes color please call the office.  7. Do not use creams, lotions, or ointments such as neosporin on your incisions after surgery until advised by your surgeon because they can cause removal of the dermabond glue on your incisions.    8. You may experience vaginal spotting after surgery or around the 6-8 week mark from surgery when the stitches at the top of the vagina begin to dissolve.  The spotting is normal but if you experience heavy bleeding, call our office.  9. Take Tylenol or ibuprofen first for pain and only use narcotic pain medication for severe pain not relieved by the Tylenol or Ibuprofen.  Monitor your Tylenol intake to a max of 4,000 mg in a 24 hour period. You can alternate these medications after surgery.  Diet: 1. Low sodium Heart Healthy Diet is recommended but you are cleared to resume your normal (before surgery) diet after your procedure.  2. It is safe to use a laxative, such as Miralax or Colace, if you have difficulty moving your bowels. You have been prescribed Sennakot at bedtime every evening to keep bowel movements regular and to prevent constipation.    Wound Care: 1.  Keep clean and dry.  Shower daily.  Reasons to call the Doctor:  Fever - Oral temperature greater than 100.4 degrees Fahrenheit  Foul-smelling vaginal discharge  Difficulty urinating  Nausea and vomiting  Increased pain at the site of the incision that is unrelieved with pain medicine.  Difficulty  breathing with or without chest pain  New calf pain especially if only on one side  Sudden, continuing increased vaginal bleeding with or without clots.   Contacts: For questions or concerns you should contact:  Dr. Everitt Amber at [phone]  Joylene John, NP at [phone]  After Hours: call [phone] and have the GYN Oncologist paged/contacted (after 5 pm or on the weekends).  Messages sent via mychart are for non-urgent matters and are not responded to after hours so for urgent needs, please call the after hours number.

## 2020-08-26 NOTE — H&P (View-Only) (Signed)
Consult Note: Gyn-Onc  Consult was requested by Dr. Stann Mainland for the evaluation of Brittany Lawson 65 y.o. female  CC:  Chief Complaint  Patient presents with  . Adnexal mass    Assessment/Plan:  Ms. Brittany Lawson  is a 65 y.o.  year old with a 8.3cm left ovarian cyst and mildly thickened endometrium (16mm).  I personally reviewed the images from her September CT scan and February ultrasound and this cyst appears most likely benign given its mostly cystic (not solid) composition and very regular peripheral borders.  We will check tumor markers today to further characterize the mass.   I explained to the patient that I have a low suspicion that this was ovarian cancer, however the recommendation is for surgical evaluation for definitive pathologic diagnosis.  She is a good candidate for surgical procedure at average risk for complications.  Therefore I am recommending a robotic assisted total hysterectomy with BSO, frozen section, and surgical staging with lymphadenopathy and omentectomy and peritoneal biopsies if indicated based on frozen section results.  I counseled the patient about surgical risks including  bleeding, infection, damage to internal organs (such as bladder,ureters, bowels), blood clot, reoperation and rehospitalization.  I discussed that provided tumor markers are normal, an alternative to surgery would be expectant management with serial imaging of the cyst.  The patient declines this.  I counseled the patient about the anticipated length of stay (same-day discharge) and anticipated postoperative recovery and convalescence with restrictions.  She is agreeable to proceed.   HPI: Ms Brittany Lawson is a 65 year old P4 who was seen in consultation at the request of Dr Stann Mainland for evaluation of an 8cm left ovarian cyst.  The patient experienced an episode of left flank pain on March 31, 2020 which resulted in her being seen in the [facility] emergency department for further  evaluation.  A CT scan without contrast was performed at that time and the images from this were reviewed by me.  It revealed a left sided 82mm renal stone causing mild hydroureteronephrosis.  An incidental finding of a 4.9 cm left adnexal cyst was also identified.  The uterus and right adnexa were normal.  Recommendation was made for follow-up imaging of the cyst in 6 to 12 months.  The patient saw Dr. Para March in February 2022 for follow-up at that time underwent a transvaginal ultrasound scan on 08/20/2020 which showed a uterus measuring 8.6 x 2.6 x 3.7 cm.  It was retroverted.  The endometrium was inadequately visualized but heterogeneous and appeared to measure the 10 mm in thickness.  The right ovary was not visualized.  The left ovary measured 9.3 x 6.2 x 6.6 cm and contain a complex cyst measuring 8.3 x 6 x 5.3 cm with a thick irregular septation greater than 3 mm.  There was a few scattered internal echoes.  Her medical history is most significant for depression, hypertension, difficulty sleeping and insomnia for which she takes trazodone.  Her surgical history is most significant for laparoscopic cholecystectomy and tubal ligation.  Her gynecologic history is remarkable for 4 prior vaginal deliveries.  She posterior menopause at age 72 and reported no postmenopausal bleeding since that time.  Her family cancer history is remarkable for her mother being diagnosed with breast cancer at age 78.  She has some maternal great cousins who may have had breast cancer but no siblings, or aunts or uncles or grandparents with a cancer diagnosis.  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 She lives with her husband.   Current Meds:  Outpatient Encounter Medications as of 08/26/2020  Medication Sig  . albuterol (VENTOLIN HFA) 108 (90 Base) MCG/ACT inhaler ProAir HFA 90 mcg/actuation aerosol inhaler  . ALPRAZolam (XANAX) 0.5 MG tablet 1 po qd prn anxiety  . amLODipine  (NORVASC) 5 MG tablet TAKE 1 TABLET BY MOUTH DAILY  . Fluticasone-Salmeterol (ADVAIR DISKUS) 250-50 MCG/DOSE AEPB Inhale 1 puff into the lungs in the morning and at bedtime.  Marland Kitchen losartan (COZAAR) 100 MG tablet TAKE 1 TABLET BY MOUTH DAILY  . meclizine (ANTIVERT) 25 MG tablet Take 25 mg by mouth 3 (three) times daily as needed.  . montelukast (SINGULAIR) 10 MG tablet TAKE 1 TABLET BY MOUTH DAILY  . pantoprazole (PROTONIX) 40 MG tablet TAKE 1 TABLET BY MOUTH TWICE A DAY  . traZODone (DESYREL) 50 MG tablet TAKE 1 TABLET BY MOUTH AT BEDTIME  . venlafaxine XR (EFFEXOR-XR) 75 MG 24 hr capsule TAKE 1 CAPSULE BY MOUTH 2 TIMES DAILY  . [DISCONTINUED] etodolac (LODINE) 400 MG tablet Take 400 mg by mouth 2 (two) times daily.   No facility-administered encounter medications on file as of 08/26/2020.    Allergy:  Allergies  Allergen Reactions  . Sulfa Antibiotics Nausea Only and Other (See Comments)    Social Hx:   Social History   Socioeconomic History  . Marital status: Married    Spouse name: Not on file  . Number of children: 4  . Years of education: Not on file  . Highest education level: Not on file  Occupational History  . Occupation: homemaker  Tobacco Use  . Smoking status: Former Smoker    Types: Cigarettes  . Smokeless tobacco: Never Used  Substance and Sexual Activity  . Alcohol use: Not Currently    Comment: past smoker quit 20 years ago  . Drug use: Never  . Sexual activity: Not Currently  Other Topics Concern  . Not on file  Social History Narrative  . Not on file   Social Determinants of Health   Financial Resource Strain: Not on file  Food Insecurity: Not on file  Transportation Needs: Not on file  Physical Activity: Not on file  Stress: Not on file  Social Connections: Not on file  Intimate Partner Violence: Not on file    Past Surgical Hx:  Past Surgical History:  Procedure Laterality Date  . AUGMENTATION MAMMAPLASTY Bilateral   . BREAST EXCISIONAL  BIOPSY Right    APPROX 20 YEARS AGO  . CHOLECYSTECTOMY    . TUBAL LIGATION      Past Medical Hx:  Past Medical History:  Diagnosis Date  . Anxiety   . Colon polyps 2019  . GERD (gastroesophageal reflux disease)     Past Gynecological History:  See HPI No LMP recorded. Patient is postmenopausal.  Family Hx:  Family History  Problem Relation Age of Onset  . Breast cancer Mother 23    Review of Systems:  Constitutional  Feels well,   ENT Normal appearing ears and nares bilaterally Skin/Breast  No rash, sores, jaundice, itching, dryness Cardiovascular  No chest pain, shortness of breath, or edema  Pulmonary  No cough or wheeze.  Gastro Intestinal  No nausea, vomitting, or diarrhoea. No bright red blood per rectum, no abdominal pain, change in bowel movement, or constipation.  Genito Urinary  No frequency, urgency, dysuria, no bleeding, Intermittent left sided pains Musculo Skeletal  No myalgia, arthralgia, joint swelling or pain  Neurologic  No weakness, numbness, change in gait,  Psychology  No depression, anxiety, insomnia.   Vitals:  Blood pressure (!) 151/82, pulse (!) 102, temperature 98.8 F (37.1 C), temperature source Oral, resp. rate 18, height 5\' 5"  (1.651 m), weight 178 lb 9.6 oz (81 kg), SpO2 98 %.  Physical Exam: WD in NAD Neck  Supple NROM, without any enlargements.  Lymph Node Survey No cervical supraclavicular or inguinal adenopathy Cardiovascular  Well perfused peripheries Lungs  No increased WOB Skin  No rash/lesions/breakdown  Psychiatry  Alert and oriented to person, place, and time  Abdomen  Normoactive bowel sounds, abdomen soft, non-tender and obese without evidence of hernia.  Back No CVA tenderness Genito Urinary  Vulva/vagina: Normal external female genitalia.  No lesions. No discharge or bleeding.  Bladder/urethra:  No lesions or masses, well supported bladder  Vagina: normal  Cervix: Normal appearing, no lesions.  Uterus:   Small, mobile, no parametrial involvement or nodularity.  Adnexa: no palpable masses. Rectal  Good tone, no masses no cul de sac nodularity.  Extremities  No bilateral cyanosis, clubbing or edema.  60 minutes of total time was spent for this patient encounter, including preparation, face-to-face counseling with the patient and coordination of care, review of imaging (results and images), communication with the referring provider and documentation of the encounter.   Thereasa Solo, MD  08/26/2020, 9:49 AM

## 2020-08-26 NOTE — Progress Notes (Signed)
Consult Note: Gyn-Onc  Consult was requested by Dr. Stann Mainland for the evaluation of Brittany Lawson 65 y.o. female  CC:  Chief Complaint  Patient presents with  . Adnexal mass    Assessment/Plan:  Brittany Lawson  is a 65 y.o.  year old with a 8.3cm left ovarian cyst and mildly thickened endometrium (20mm).  I personally reviewed the images from her September CT scan and February ultrasound and this cyst appears most likely benign given its mostly cystic (not solid) composition and very regular peripheral borders.  We will check tumor markers today to further characterize the mass.   I explained to the patient that I have a low suspicion that this was ovarian cancer, however the recommendation is for surgical evaluation for definitive pathologic diagnosis.  She is a good candidate for surgical procedure at average risk for complications.  Therefore I am recommending a robotic assisted total hysterectomy with BSO, frozen section, and surgical staging with lymphadenopathy and omentectomy and peritoneal biopsies if indicated based on frozen section results.  I counseled the patient about surgical risks including  bleeding, infection, damage to internal organs (such as bladder,ureters, bowels), blood clot, reoperation and rehospitalization.  I discussed that provided tumor markers are normal, an alternative to surgery would be expectant management with serial imaging of the cyst.  The patient declines this.  I counseled the patient about the anticipated length of stay (same-day discharge) and anticipated postoperative recovery and convalescence with restrictions.  She is agreeable to proceed.   HPI: Ms Brittany Lawson is a 65 year old P4 who was seen in consultation at the request of Dr Stann Mainland for evaluation of an 8cm left ovarian cyst.  The patient experienced an episode of left flank pain on March 31, 2020 which resulted in her being seen in the [facility] emergency department for further  evaluation.  A CT scan without contrast was performed at that time and the images from this were reviewed by me.  It revealed a left sided 52mm renal stone causing mild hydroureteronephrosis.  An incidental finding of a 4.9 cm left adnexal cyst was also identified.  The uterus and right adnexa were normal.  Recommendation was made for follow-up imaging of the cyst in 6 to 12 months.  The patient saw Dr. Para March in February 2022 for follow-up at that time underwent a transvaginal ultrasound scan on 08/20/2020 which showed a uterus measuring 8.6 x 2.6 x 3.7 cm.  It was retroverted.  The endometrium was inadequately visualized but heterogeneous and appeared to measure the 10 mm in thickness.  The right ovary was not visualized.  The left ovary measured 9.3 x 6.2 x 6.6 cm and contain a complex cyst measuring 8.3 x 6 x 5.3 cm with a thick irregular septation greater than 3 mm.  There was a few scattered internal echoes.  Her medical history is most significant for depression, hypertension, difficulty sleeping and insomnia for which she takes trazodone.  Her surgical history is most significant for laparoscopic cholecystectomy and tubal ligation.  Her gynecologic history is remarkable for 4 prior vaginal deliveries.  She posterior menopause at age 11 and reported no postmenopausal bleeding since that time.  Her family cancer history is remarkable for her mother being diagnosed with breast cancer at age 70.  She has some maternal great cousins who may have had breast cancer but no siblings, or aunts or uncles or grandparents with a cancer diagnosis.  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 She lives with her husband.   Current Meds:  Outpatient Encounter Medications as of 08/26/2020  Medication Sig  . albuterol (VENTOLIN HFA) 108 (90 Base) MCG/ACT inhaler ProAir HFA 90 mcg/actuation aerosol inhaler  . ALPRAZolam (XANAX) 0.5 MG tablet 1 po qd prn anxiety  . amLODipine  (NORVASC) 5 MG tablet TAKE 1 TABLET BY MOUTH DAILY  . Fluticasone-Salmeterol (ADVAIR DISKUS) 250-50 MCG/DOSE AEPB Inhale 1 puff into the lungs in the morning and at bedtime.  Marland Kitchen losartan (COZAAR) 100 MG tablet TAKE 1 TABLET BY MOUTH DAILY  . meclizine (ANTIVERT) 25 MG tablet Take 25 mg by mouth 3 (three) times daily as needed.  . montelukast (SINGULAIR) 10 MG tablet TAKE 1 TABLET BY MOUTH DAILY  . pantoprazole (PROTONIX) 40 MG tablet TAKE 1 TABLET BY MOUTH TWICE A DAY  . traZODone (DESYREL) 50 MG tablet TAKE 1 TABLET BY MOUTH AT BEDTIME  . venlafaxine XR (EFFEXOR-XR) 75 MG 24 hr capsule TAKE 1 CAPSULE BY MOUTH 2 TIMES DAILY  . [DISCONTINUED] etodolac (LODINE) 400 MG tablet Take 400 mg by mouth 2 (two) times daily.   No facility-administered encounter medications on file as of 08/26/2020.    Allergy:  Allergies  Allergen Reactions  . Sulfa Antibiotics Nausea Only and Other (See Comments)    Social Hx:   Social History   Socioeconomic History  . Marital status: Married    Spouse name: Not on file  . Number of children: 4  . Years of education: Not on file  . Highest education level: Not on file  Occupational History  . Occupation: homemaker  Tobacco Use  . Smoking status: Former Smoker    Types: Cigarettes  . Smokeless tobacco: Never Used  Substance and Sexual Activity  . Alcohol use: Not Currently    Comment: past smoker quit 20 years ago  . Drug use: Never  . Sexual activity: Not Currently  Other Topics Concern  . Not on file  Social History Narrative  . Not on file   Social Determinants of Health   Financial Resource Strain: Not on file  Food Insecurity: Not on file  Transportation Needs: Not on file  Physical Activity: Not on file  Stress: Not on file  Social Connections: Not on file  Intimate Partner Violence: Not on file    Past Surgical Hx:  Past Surgical History:  Procedure Laterality Date  . AUGMENTATION MAMMAPLASTY Bilateral   . BREAST EXCISIONAL  BIOPSY Right    APPROX 20 YEARS AGO  . CHOLECYSTECTOMY    . TUBAL LIGATION      Past Medical Hx:  Past Medical History:  Diagnosis Date  . Anxiety   . Colon polyps 2019  . GERD (gastroesophageal reflux disease)     Past Gynecological History:  See HPI No LMP recorded. Patient is postmenopausal.  Family Hx:  Family History  Problem Relation Age of Onset  . Breast cancer Mother 71    Review of Systems:  Constitutional  Feels well,   ENT Normal appearing ears and nares bilaterally Skin/Breast  No rash, sores, jaundice, itching, dryness Cardiovascular  No chest pain, shortness of breath, or edema  Pulmonary  No cough or wheeze.  Gastro Intestinal  No nausea, vomitting, or diarrhoea. No bright red blood per rectum, no abdominal pain, change in bowel movement, or constipation.  Genito Urinary  No frequency, urgency, dysuria, no bleeding, Intermittent left sided pains Musculo Skeletal  No myalgia, arthralgia, joint swelling or pain  Neurologic  No weakness, numbness, change in gait,  Psychology  No depression, anxiety, insomnia.   Vitals:  Blood pressure (!) 151/82, pulse (!) 102, temperature 98.8 F (37.1 C), temperature source Oral, resp. rate 18, height 5\' 5"  (1.651 m), weight 178 lb 9.6 oz (81 kg), SpO2 98 %.  Physical Exam: WD in NAD Neck  Supple NROM, without any enlargements.  Lymph Node Survey No cervical supraclavicular or inguinal adenopathy Cardiovascular  Well perfused peripheries Lungs  No increased WOB Skin  No rash/lesions/breakdown  Psychiatry  Alert and oriented to person, place, and time  Abdomen  Normoactive bowel sounds, abdomen soft, non-tender and obese without evidence of hernia.  Back No CVA tenderness Genito Urinary  Vulva/vagina: Normal external female genitalia.  No lesions. No discharge or bleeding.  Bladder/urethra:  No lesions or masses, well supported bladder  Vagina: normal  Cervix: Normal appearing, no lesions.  Uterus:   Small, mobile, no parametrial involvement or nodularity.  Adnexa: no palpable masses. Rectal  Good tone, no masses no cul de sac nodularity.  Extremities  No bilateral cyanosis, clubbing or edema.  60 minutes of total time was spent for this patient encounter, including preparation, face-to-face counseling with the patient and coordination of care, review of imaging (results and images), communication with the referring provider and documentation of the encounter.   Thereasa Solo, MD  08/26/2020, 9:49 AM

## 2020-08-27 ENCOUNTER — Telehealth: Payer: Self-pay

## 2020-08-27 LAB — CA 125: Cancer Antigen (CA) 125: 8.2 U/mL (ref 0.0–38.1)

## 2020-08-27 NOTE — Telephone Encounter (Signed)
Told Brittany Lawson that the CEA and CA-125 were WNL. Pt saw results on MyChart. She verbalized understanding.

## 2020-09-08 ENCOUNTER — Encounter (HOSPITAL_BASED_OUTPATIENT_CLINIC_OR_DEPARTMENT_OTHER): Payer: Self-pay | Admitting: Gynecologic Oncology

## 2020-09-08 ENCOUNTER — Other Ambulatory Visit: Payer: Self-pay

## 2020-09-08 NOTE — Progress Notes (Signed)
YOU ARE SCHEDULED FOR A COVID TEST 09-13-2020 @ 1030 AM. THIS TEST MUST BE DONE BEFORE SURGERY. GO TO  [facility] [place], [place], IT IS APPROXIMATELY 2 MINUTES PAST [employer] ON THE RIGHT AND REMAIN IN YOUR CAR, THIS IS A DRIVE UP TEST. ONCE YOUR COVID TEST IS DONE PLEASE FOLLOW ALL THE QUARANTINE  INSTRUCTIONS GIVEN IN YOUR HANDOUT.      Your procedure is scheduled on 09-15-2020  Report to [facility]   Call this number if you have problems the morning of surgery  [phone].   OUR ADDRESS IS [address].  WE ARE LOCATED IN [facility].  PLEASE BRING YOUR INSURANCE CARD AND PHOTO ID DAY OF SURGERY.  ONLY ONE PERSON ALLOWED IN FACILITY WAITING AREA.                                     REMEMBER:  DO NOT EAT FOOD, CANDY GUM OR MINTS  AFTER MIDNIGHT . YOU MAY HAVE CLEAR LIQUIDS FROM MIDNIGHT UNTIL 530 AM . NO CLEAR LIQUIDS AFTER 530 AM DAY OF SURGERY.   YOU MAY  BRUSH YOUR TEETH MORNING OF SURGERY AND RINSE YOUR MOUTH OUT, NO CHEWING GUM CANDY OR MINTS.    CLEAR LIQUID DIET   Foods Allowed                                                                     Foods Excluded  Coffee and tea, regular and decaf                             liquids that you cannot  Plain Jell-O any favor except red or purple                                           see through such as: Fruit ices (not with fruit pulp)                                     milk, soups, orange juice  Iced Popsicles                                    All solid food Carbonated beverages, regular and diet                                    Cranberry, grape and apple juices Sports drinks like Gatorade Lightly seasoned clear broth or consume(fat free) Sugar, honey syrup  Sample Menu Breakfast                                Lunch  Supper Cranberry juice                    Beef broth                            Chicken  broth Jell-O                                     Grape juice                           Apple juice Coffee or tea                        Jell-O                                      Popsicle                                                Coffee or tea                        Coffee or tea  _____________________________________________________________________     TAKE THESE MEDICATIONS MORNING OF SURGERY WITH A SIP OF WATER: TAKE ALPRAZOLAM (XANAX) IF NEEDED, PANTAPRAZOLE, VENLAFAXINE, TAKE MECLIZINE IF NEEDED, USE IF NEEDED AND BRING ADVAIR AND ALBUTEROLINHALERS DAY OF SURGERY.  ONE VISITOR IS ALLOWED IN WAITING ROOM ONLY DAY OF SURGERY.  NO VISITOR MAY SPEND THE NIGHT.  VISITOR ARE ALLOWED TO STAY UNTIL 800 PM.                                    DO NOT WEAR JEWERLY, MAKE UP, OR NAIL POLISH ON FINGERNAILS. DO NOT WEAR LOTIONS, POWDERS, PERFUMES OR DEODORANT. DO NOT SHAVE FOR 24 HOURS PRIOR TO DAY OF SURGERY. MEN MAY SHAVE FACE AND NECK. CONTACTS, GLASSES, OR DENTURES MAY NOT BE WORN TO SURGERY.                                    [facility] IS NOT RESPONSIBLE  FOR ANY BELONGINGS.                                                                    Marland Kitchen           [facility] - Preparing for Surgery Before surgery, you can play an important role.  Because skin is not sterile, your skin needs to be as free of germs as possible.  You can reduce the number of germs on your skin by washing with CHG (chlorahexidine gluconate) soap before surgery.  CHG is an antiseptic cleaner which kills germs and bonds with the skin to continue killing  germs even after washing. Please DO NOT use if you have an allergy to CHG or antibacterial soaps.  If your skin becomes reddened/irritated stop using the CHG and inform your nurse when you arrive at Short Stay. Do not shave (including legs and underarms) for at least 48 hours prior to the first CHG shower.  You may shave your face/neck. Please follow these instructions  carefully:  1.  Shower with CHG Soap the night before surgery and the  morning of Surgery.  2.  If you choose to wash your hair, wash your hair first as usual with your  normal  shampoo.  3.  After you shampoo, rinse your hair and body thoroughly to remove the  shampoo.                            4.  Use CHG as you would any other liquid soap.  You can apply chg directly  to the skin and wash                      Gently with a scrungie or clean washcloth.  5.  Apply the CHG Soap to your body ONLY FROM THE NECK DOWN.   Do not use on face/ open                           Wound or open sores. Avoid contact with eyes, ears mouth and genitals (private parts).                       Wash face,  Genitals (private parts) with your normal soap.             6.  Wash thoroughly, paying special attention to the area where your surgery  will be performed.  7.  Thoroughly rinse your body with warm water from the neck down.  8.  DO NOT shower/wash with your normal soap after using and rinsing off  the CHG Soap.                9.  Pat yourself dry with a clean towel.            10.  Wear clean pajamas.            11.  Place clean sheets on your bed the night of your first shower and do not  sleep with pets. Day of Surgery : Do not apply any lotions/deodorants the morning of surgery.  Please wear clean clothes to the hospital/surgery center.  FAILURE TO FOLLOW THESE INSTRUCTIONS MAY RESULT IN THE CANCELLATION OF YOUR SURGERY PATIENT SIGNATURE_________________________________  NURSE SIGNATURE__________________________________  ________________________________________________________________________                                                        QUESTIONS Hansel Feinstein PRE OP NURSE PHONE [phone]

## 2020-09-08 NOTE — Progress Notes (Signed)
Spoke w/ via phone for pre-op interview---pt Lab needs dos----  none             Lab results------has lab appt 09-13-2020 930 am for cbc cmp, ua ekg t & s COVID test ------09-13-2020 1030 Arrive at -------630 am 09-15-2020 NPO after MN NO Solid Food.  Clear liquids from MN until---530 am then npo Medications to take morning of surgery -----alprazolam prn, pantaprazole, venlafaxine, meclizine if needed, use bring inhalers Diabetic medication -----n/a Patient Special Instructions -----none Pre-Op special Istructions -----none Patient verbalized understanding of instructions that were given at this phone interview. Patient denies shortness of breath, chest pain, fever, cough at this phone interview.

## 2020-09-13 ENCOUNTER — Encounter (HOSPITAL_COMMUNITY)
Admission: RE | Admit: 2020-09-13 | Discharge: 2020-09-13 | Disposition: A | Discharge status: Home / Self Care | Payer: Medicare Other | Source: Ambulatory Visit | Attending: Gynecologic Oncology | Admitting: Gynecologic Oncology

## 2020-09-13 ENCOUNTER — Other Ambulatory Visit (HOSPITAL_COMMUNITY)
Admission: RE | Admit: 2020-09-13 | Discharge: 2020-09-13 | Disposition: A | Discharge status: Home / Self Care | Payer: Medicare Other | Source: Ambulatory Visit | Attending: Gynecologic Oncology | Admitting: Gynecologic Oncology

## 2020-09-13 ENCOUNTER — Other Ambulatory Visit: Payer: Self-pay

## 2020-09-13 DIAGNOSIS — I10 Essential (primary) hypertension: Secondary | ICD-10-CM | POA: Insufficient documentation

## 2020-09-13 DIAGNOSIS — Z01818 Encounter for other preprocedural examination: Secondary | ICD-10-CM | POA: Insufficient documentation

## 2020-09-13 DIAGNOSIS — Z01812 Encounter for preprocedural laboratory examination: Secondary | ICD-10-CM | POA: Insufficient documentation

## 2020-09-13 DIAGNOSIS — Z20822 Contact with and (suspected) exposure to covid-19: Secondary | ICD-10-CM | POA: Insufficient documentation

## 2020-09-13 LAB — COMPREHENSIVE METABOLIC PANEL
ALT: 15 U/L (ref 0–44)
AST: 16 U/L (ref 15–41)
Albumin: 3.9 g/dL (ref 3.5–5.0)
Alkaline Phosphatase: 61 U/L (ref 38–126)
Anion gap: 12 (ref 5–15)
BUN: 16 mg/dL (ref 8–23)
CO2: 23 mmol/L (ref 22–32)
Calcium: 9 mg/dL (ref 8.9–10.3)
Chloride: 104 mmol/L (ref 98–111)
Creatinine, Ser: 0.86 mg/dL (ref 0.44–1.00)
GFR, Estimated: >60 mL/min (ref >60)
Glucose, Bld: 86 mg/dL (ref 70–99)
Potassium: 4.1 mmol/L (ref 3.5–5.1)
Sodium: 139 mmol/L (ref 135–145)
Total Bilirubin: 0.3 mg/dL (ref 0.3–1.2)
Total Protein: 7.6 g/dL (ref 6.5–8.1)

## 2020-09-13 LAB — URINALYSIS, ROUTINE W REFLEX MICROSCOPIC
Bilirubin Urine: NEGATIVE
Glucose, UA: NEGATIVE mg/dL
Hgb urine dipstick: NEGATIVE
Ketones, ur: NEGATIVE mg/dL
Leukocytes,Ua: NEGATIVE
Nitrite: NEGATIVE
Protein, ur: NEGATIVE mg/dL
Specific Gravity, Urine: 1.023 (ref 1.005–1.030)
pH: 5 (ref 5.0–8.0)

## 2020-09-13 LAB — CBC
HCT: 41.2 % (ref 36.0–46.0)
Hemoglobin: 13 g/dL (ref 12.0–15.0)
MCH: 26.8 pg (ref 26.0–34.0)
MCHC: 31.6 g/dL (ref 30.0–36.0)
MCV: 84.9 fL (ref 80.0–100.0)
Platelets: 270 10*3/uL (ref 150–400)
RBC: 4.85 MIL/uL (ref 3.87–5.11)
RDW: 13.3 % (ref 11.5–15.5)
WBC: 7.8 10*3/uL (ref 4.0–10.5)
nRBC: 0 % (ref 0.0–0.2)

## 2020-09-13 LAB — SARS CORONAVIRUS 2 (TAT 6-24 HRS): SARS Coronavirus 2: NEGATIVE

## 2020-09-14 ENCOUNTER — Telehealth: Payer: Self-pay

## 2020-09-14 NOTE — Telephone Encounter (Signed)
LM for Brittany Lawson to call back to the office to discuss pre-op instructions,especially if she has any questions or concerns about the instructions and to verify receipt of message.

## 2020-09-14 NOTE — Telephone Encounter (Signed)
Brittany Lawson states that she understands the written pre-op instructions and has no questions or concerns at this time.

## 2020-09-15 ENCOUNTER — Ambulatory Visit (HOSPITAL_BASED_OUTPATIENT_CLINIC_OR_DEPARTMENT_OTHER): Payer: Medicare Other | Admitting: Anesthesiology

## 2020-09-15 ENCOUNTER — Encounter (HOSPITAL_BASED_OUTPATIENT_CLINIC_OR_DEPARTMENT_OTHER)
Admission: RE | Disposition: A | Discharge status: Home / Self Care | Payer: Self-pay | Source: Home / Self Care | Attending: Gynecologic Oncology

## 2020-09-15 ENCOUNTER — Encounter (HOSPITAL_BASED_OUTPATIENT_CLINIC_OR_DEPARTMENT_OTHER): Payer: Self-pay | Admitting: Gynecologic Oncology

## 2020-09-15 ENCOUNTER — Ambulatory Visit (HOSPITAL_BASED_OUTPATIENT_CLINIC_OR_DEPARTMENT_OTHER)
Admission: RE | Admit: 2020-09-15 | Discharge: 2020-09-15 | Disposition: A | Discharge status: Home / Self Care | Payer: Medicare Other | Attending: Gynecologic Oncology | Admitting: Gynecologic Oncology

## 2020-09-15 DIAGNOSIS — Z79899 Other long term (current) drug therapy: Secondary | ICD-10-CM | POA: Insufficient documentation

## 2020-09-15 DIAGNOSIS — D271 Benign neoplasm of left ovary: Secondary | ICD-10-CM | POA: Insufficient documentation

## 2020-09-15 DIAGNOSIS — Z803 Family history of malignant neoplasm of breast: Secondary | ICD-10-CM | POA: Diagnosis not present

## 2020-09-15 DIAGNOSIS — I10 Essential (primary) hypertension: Secondary | ICD-10-CM | POA: Insufficient documentation

## 2020-09-15 DIAGNOSIS — Z87891 Personal history of nicotine dependence: Secondary | ICD-10-CM | POA: Insufficient documentation

## 2020-09-15 DIAGNOSIS — Z882 Allergy status to sulfonamides status: Secondary | ICD-10-CM | POA: Diagnosis not present

## 2020-09-15 DIAGNOSIS — R9389 Abnormal findings on diagnostic imaging of other specified body structures: Secondary | ICD-10-CM | POA: Diagnosis not present

## 2020-09-15 DIAGNOSIS — G4700 Insomnia, unspecified: Secondary | ICD-10-CM | POA: Insufficient documentation

## 2020-09-15 DIAGNOSIS — F32A Depression, unspecified: Secondary | ICD-10-CM | POA: Insufficient documentation

## 2020-09-15 DIAGNOSIS — Z7951 Long term (current) use of inhaled steroids: Secondary | ICD-10-CM | POA: Insufficient documentation

## 2020-09-15 DIAGNOSIS — Z9049 Acquired absence of other specified parts of digestive tract: Secondary | ICD-10-CM | POA: Diagnosis not present

## 2020-09-15 DIAGNOSIS — N840 Polyp of corpus uteri: Secondary | ICD-10-CM | POA: Insufficient documentation

## 2020-09-15 DIAGNOSIS — N888 Other specified noninflammatory disorders of cervix uteri: Secondary | ICD-10-CM | POA: Diagnosis not present

## 2020-09-15 DIAGNOSIS — N83202 Unspecified ovarian cyst, left side: Secondary | ICD-10-CM | POA: Diagnosis present

## 2020-09-15 HISTORY — DX: Essential (primary) hypertension: I10

## 2020-09-15 HISTORY — DX: Acute embolism and thrombosis of unspecified vein: I82.90

## 2020-09-15 HISTORY — PX: ROBOTIC ASSISTED TOTAL HYSTERECTOMY WITH BILATERAL SALPINGO OOPHERECTOMY: SHX6086

## 2020-09-15 HISTORY — DX: Unspecified asthma, uncomplicated: J45.909

## 2020-09-15 HISTORY — DX: Constipation, unspecified: K59.00

## 2020-09-15 HISTORY — DX: Unspecified ovarian cyst, left side: N83.202

## 2020-09-15 HISTORY — DX: Asymptomatic varicose veins of bilateral lower extremities: I83.93

## 2020-09-15 HISTORY — DX: Depression, unspecified: F32.A

## 2020-09-15 LAB — TYPE AND SCREEN
ABO/RH(D): A NEG
Antibody Screen: NEGATIVE

## 2020-09-15 LAB — ABO/RH: ABO/RH(D): A NEG

## 2020-09-15 SURGERY — HYSTERECTOMY, TOTAL, ROBOT-ASSISTED, LAPAROSCOPIC, WITH BILATERAL SALPINGO-OOPHORECTOMY
Anesthesia: General | Site: Abdomen

## 2020-09-15 MED ORDER — LACTATED RINGERS IV SOLN
INTRAVENOUS | Status: DC
  Administered 2020-09-15: 1000 mL via INTRAVENOUS

## 2020-09-15 MED ORDER — LIDOCAINE HCL (PF) 2 % IJ SOLN
INTRAMUSCULAR | Status: AC
  Filled 2020-09-15: qty 5

## 2020-09-15 MED ORDER — FENTANYL CITRATE (PF) 100 MCG/2ML IJ SOLN
INTRAMUSCULAR | Status: DC | PRN
  Administered 2020-09-15 (×3): 50 ug via INTRAVENOUS

## 2020-09-15 MED ORDER — ROCURONIUM BROMIDE 100 MG/10ML IV SOLN
INTRAVENOUS | Status: DC | PRN
  Administered 2020-09-15: 20 mg via INTRAVENOUS
  Administered 2020-09-15: 50 mg via INTRAVENOUS

## 2020-09-15 MED ORDER — SODIUM CHLORIDE 0.9 % IR SOLN
Status: DC | PRN
  Administered 2020-09-15: 1000 mL

## 2020-09-15 MED ORDER — ONDANSETRON HCL 4 MG/2ML IJ SOLN
INTRAMUSCULAR | Status: DC | PRN
  Administered 2020-09-15: 4 mg via INTRAVENOUS

## 2020-09-15 MED ORDER — ONDANSETRON HCL 4 MG/2ML IJ SOLN
INTRAMUSCULAR | Status: AC
  Filled 2020-09-15: qty 2

## 2020-09-15 MED ORDER — GLYCOPYRROLATE 0.2 MG/ML IJ SOLN
INTRAMUSCULAR | Status: DC | PRN
  Administered 2020-09-15: .2 mg via INTRAVENOUS

## 2020-09-15 MED ORDER — LIDOCAINE HCL (PF) 2 % IJ SOLN
INTRAMUSCULAR | Status: AC
  Filled 2020-09-15: qty 10

## 2020-09-15 MED ORDER — LIDOCAINE HCL (CARDIAC) PF 100 MG/5ML IV SOSY
PREFILLED_SYRINGE | INTRAVENOUS | Status: DC | PRN
  Administered 2020-09-15: 80 mg via INTRAVENOUS

## 2020-09-15 MED ORDER — MIDAZOLAM HCL 2 MG/2ML IJ SOLN
INTRAMUSCULAR | Status: DC | PRN
  Administered 2020-09-15: 1 mg via INTRAVENOUS

## 2020-09-15 MED ORDER — FENTANYL CITRATE (PF) 250 MCG/5ML IJ SOLN
INTRAMUSCULAR | Status: AC
  Filled 2020-09-15: qty 5

## 2020-09-15 MED ORDER — ACETAMINOPHEN 500 MG PO TABS
ORAL_TABLET | ORAL | Status: AC
  Filled 2020-09-15: qty 2

## 2020-09-15 MED ORDER — LIDOCAINE HCL (PF) 2 % IJ SOLN
INTRAMUSCULAR | Status: DC | PRN
  Administered 2020-09-15: 1 mg/kg/h via INTRADERMAL

## 2020-09-15 MED ORDER — PROPOFOL 10 MG/ML IV BOLUS
INTRAVENOUS | Status: DC | PRN
  Administered 2020-09-15: 150 mg via INTRAVENOUS

## 2020-09-15 MED ORDER — OXYCODONE HCL 5 MG/5ML PO SOLN: 5.0000 mg | Freq: Once | ORAL | Status: AC | PRN

## 2020-09-15 MED ORDER — KETAMINE HCL 10 MG/ML IJ SOLN
INTRAMUSCULAR | Status: DC | PRN
  Administered 2020-09-15: 40 mg via INTRAVENOUS

## 2020-09-15 MED ORDER — DEXAMETHASONE SODIUM PHOSPHATE 10 MG/ML IJ SOLN
INTRAMUSCULAR | Status: AC
  Filled 2020-09-15: qty 1

## 2020-09-15 MED ORDER — MIDAZOLAM HCL 2 MG/2ML IJ SOLN
INTRAMUSCULAR | Status: AC
  Filled 2020-09-15: qty 2

## 2020-09-15 MED ORDER — CEFAZOLIN SODIUM-DEXTROSE 2-4 GM/100ML-% IV SOLN
2.0000 g | INTRAVENOUS | Status: AC
  Administered 2020-09-15: 2 g via INTRAVENOUS

## 2020-09-15 MED ORDER — ROCURONIUM BROMIDE 10 MG/ML (PF) SYRINGE
PREFILLED_SYRINGE | INTRAVENOUS | Status: AC
  Filled 2020-09-15: qty 10

## 2020-09-15 MED ORDER — GABAPENTIN 300 MG PO CAPS
300.0000 mg | ORAL_CAPSULE | ORAL | Status: AC
  Administered 2020-09-15: 300 mg via ORAL

## 2020-09-15 MED ORDER — CEFAZOLIN SODIUM-DEXTROSE 2-4 GM/100ML-% IV SOLN
INTRAVENOUS | Status: AC
  Filled 2020-09-15: qty 100

## 2020-09-15 MED ORDER — FENTANYL CITRATE (PF) 100 MCG/2ML IJ SOLN
25.0000 ug | INTRAMUSCULAR | Status: DC | PRN
  Administered 2020-09-15: 25 ug via INTRAVENOUS

## 2020-09-15 MED ORDER — OXYCODONE HCL 5 MG PO TABS
5.0000 mg | ORAL_TABLET | Freq: Once | ORAL | Status: AC | PRN
  Administered 2020-09-15: 5 mg via ORAL

## 2020-09-15 MED ORDER — LIDOCAINE HCL (PF) 2 % IJ SOLN
INTRAMUSCULAR | Status: AC
  Filled 2020-09-15: qty 15

## 2020-09-15 MED ORDER — ALBUTEROL SULFATE HFA 108 (90 BASE) MCG/ACT IN AERS
INHALATION_SPRAY | RESPIRATORY_TRACT | Status: AC
  Filled 2020-09-15: qty 6.7

## 2020-09-15 MED ORDER — KETAMINE HCL 10 MG/ML IJ SOLN
INTRAMUSCULAR | Status: AC
  Filled 2020-09-15: qty 1

## 2020-09-15 MED ORDER — GLYCOPYRROLATE PF 0.2 MG/ML IJ SOSY
PREFILLED_SYRINGE | INTRAMUSCULAR | Status: AC
  Filled 2020-09-15: qty 1

## 2020-09-15 MED ORDER — DEXAMETHASONE SODIUM PHOSPHATE 10 MG/ML IJ SOLN
4.0000 mg | INTRAMUSCULAR | Status: AC
  Administered 2020-09-15: 10 mg via INTRAVENOUS

## 2020-09-15 MED ORDER — OXYCODONE HCL 5 MG PO TABS
ORAL_TABLET | ORAL | Status: AC
  Filled 2020-09-15: qty 1

## 2020-09-15 MED ORDER — PROPOFOL 10 MG/ML IV BOLUS
INTRAVENOUS | Status: AC
  Filled 2020-09-15: qty 40

## 2020-09-15 MED ORDER — ACETAMINOPHEN 500 MG PO TABS
1000.0000 mg | ORAL_TABLET | ORAL | Status: AC
  Administered 2020-09-15: 1000 mg via ORAL

## 2020-09-15 MED ORDER — AMISULPRIDE (ANTIEMETIC) 5 MG/2ML IV SOLN
INTRAVENOUS | Status: AC
  Filled 2020-09-15: qty 4

## 2020-09-15 MED ORDER — FENTANYL CITRATE (PF) 100 MCG/2ML IJ SOLN
INTRAMUSCULAR | Status: AC
  Filled 2020-09-15: qty 2

## 2020-09-15 MED ORDER — SCOPOLAMINE 1 MG/3DAYS TD PT72
MEDICATED_PATCH | TRANSDERMAL | Status: AC
  Filled 2020-09-15: qty 1

## 2020-09-15 MED ORDER — SCOPOLAMINE 1 MG/3DAYS TD PT72
1.0000 | MEDICATED_PATCH | TRANSDERMAL | Status: DC
  Administered 2020-09-15: 1.5 mg via TRANSDERMAL

## 2020-09-15 MED ORDER — SUGAMMADEX SODIUM 200 MG/2ML IV SOLN
INTRAVENOUS | Status: DC | PRN
  Administered 2020-09-15: 200 mg via INTRAVENOUS

## 2020-09-15 MED ORDER — ENOXAPARIN SODIUM 40 MG/0.4ML ~~LOC~~ SOLN
40.0000 mg | SUBCUTANEOUS | Status: AC
  Administered 2020-09-15: 40 mg via SUBCUTANEOUS

## 2020-09-15 MED ORDER — PROMETHAZINE HCL 25 MG/ML IJ SOLN: 6.2500 mg | INTRAMUSCULAR | Status: DC | PRN

## 2020-09-15 MED ORDER — PROPOFOL 10 MG/ML IV BOLUS
INTRAVENOUS | Status: AC
  Filled 2020-09-15: qty 20

## 2020-09-15 MED ORDER — AMISULPRIDE (ANTIEMETIC) 5 MG/2ML IV SOLN
10.0000 mg | Freq: Once | INTRAVENOUS | Status: AC | PRN
  Administered 2020-09-15: 10 mg via INTRAVENOUS

## 2020-09-15 MED ORDER — GABAPENTIN 300 MG PO CAPS
ORAL_CAPSULE | ORAL | Status: AC
  Filled 2020-09-15: qty 1

## 2020-09-15 MED ORDER — ENOXAPARIN SODIUM 40 MG/0.4ML ~~LOC~~ SOLN
SUBCUTANEOUS | Status: AC
  Filled 2020-09-15: qty 0.4

## 2020-09-15 MED ORDER — BUPIVACAINE HCL 0.25 % IJ SOLN
INTRAMUSCULAR | Status: DC | PRN
  Administered 2020-09-15: 15 mL

## 2020-09-15 SURGICAL SUPPLY — 54 items
ADH SKN CLS APL DERMABOND .7 (GAUZE/BANDAGES/DRESSINGS) ×1
COVER BACK TABLE 60X90IN (DRAPES) ×2 IMPLANT
COVER TIP SHEARS 8 DVNC (MISCELLANEOUS) ×1 IMPLANT
COVER TIP SHEARS 8MM DA VINCI (MISCELLANEOUS) ×2
COVER WAND RF STERILE (DRAPES) ×2 IMPLANT
DECANTER SPIKE VIAL GLASS SM (MISCELLANEOUS) ×1 IMPLANT
DERMABOND ADVANCED (GAUZE/BANDAGES/DRESSINGS) ×1
DERMABOND ADVANCED .7 DNX12 (GAUZE/BANDAGES/DRESSINGS) ×1 IMPLANT
DRAPE ARM DVNC X/XI (DISPOSABLE) ×4 IMPLANT
DRAPE COLUMN DVNC XI (DISPOSABLE) ×1 IMPLANT
DRAPE DA VINCI XI ARM (DISPOSABLE) ×8
DRAPE DA VINCI XI COLUMN (DISPOSABLE) ×2
DRAPE SHEET LG 3/4 BI-LAMINATE (DRAPES) ×2 IMPLANT
DRAPE SURG IRRIG POUCH 19X23 (DRAPES) ×2 IMPLANT
ELECT REM PT RETURN 9FT ADLT (ELECTROSURGICAL) ×2
ELECTRODE REM PT RTRN 9FT ADLT (ELECTROSURGICAL) ×1 IMPLANT
GAUZE 4X4 16PLY RFD (DISPOSABLE) ×2 IMPLANT
GLOVE SURG ENC MOIS LTX SZ6 (GLOVE) ×8 IMPLANT
GLOVE SURG ENC MOIS LTX SZ6.5 (GLOVE) ×4 IMPLANT
GLOVE SURG ENC MOIS LTX SZ7.5 (GLOVE) ×2 IMPLANT
GLOVE SURG UNDER POLY LF SZ7 (GLOVE) ×1 IMPLANT
GLOVE SURG UNDER POLY LF SZ7.5 (GLOVE) ×4 IMPLANT
HEMOSTAT SURGICEL 2X14 (HEMOSTASIS) IMPLANT
HEMOSTAT SURGICEL 4X8 (HEMOSTASIS) IMPLANT
HOLDER FOLEY CATH W/STRAP (MISCELLANEOUS) ×2 IMPLANT
IRRIG SUCT STRYKERFLOW 2 WTIP (MISCELLANEOUS) ×2
IRRIGATION SUCT STRKRFLW 2 WTP (MISCELLANEOUS) ×1 IMPLANT
IV NS 1000ML (IV SOLUTION) ×2
IV NS 1000ML BAXH (IV SOLUTION) IMPLANT
KIT TURNOVER CYSTO (KITS) ×2 IMPLANT
LEGGING LITHOTOMY PAIR STRL (DRAPES) ×2 IMPLANT
MANIFOLD NEPTUNE II (INSTRUMENTS) ×1 IMPLANT
MANIPULATOR UTERINE 4.5 ZUMI (MISCELLANEOUS) ×2 IMPLANT
NEEDLE HYPO 22GX1.5 SAFETY (NEEDLE) ×2 IMPLANT
NS IRRIG 500ML POUR BTL (IV SOLUTION) ×1 IMPLANT
OBTURATOR OPTICAL STANDARD 8MM (TROCAR) ×2
OBTURATOR OPTICAL STND 8 DVNC (TROCAR) ×1
OBTURATOR OPTICALSTD 8 DVNC (TROCAR) ×1 IMPLANT
PACK ROBOT GYN CUSTOM WL (TRAY / TRAY PROCEDURE) ×2 IMPLANT
PACK ROBOTIC GOWN (GOWN DISPOSABLE) ×2 IMPLANT
PAD POSITIONING PINK XL (MISCELLANEOUS) ×2 IMPLANT
PORT ACCESS TROCAR AIRSEAL 12 (TROCAR) ×1 IMPLANT
PORT ACCESS TROCAR AIRSEAL 5M (TROCAR) ×1
SEAL CANN UNIV 5-8 DVNC XI (MISCELLANEOUS) ×3 IMPLANT
SEAL XI 5MM-8MM UNIVERSAL (MISCELLANEOUS) ×6
SET TRI-LUMEN FLTR TB AIRSEAL (TUBING) ×2 IMPLANT
SUT VIC AB 4-0 PS2 18 (SUTURE) ×4 IMPLANT
SYR 10ML LL (SYRINGE) ×1 IMPLANT
SYR CONTROL 10ML LL (SYRINGE) ×1 IMPLANT
TRAP SPECIMEN MUCUS 40CC (MISCELLANEOUS) ×1 IMPLANT
TRAY FOL W/BAG SLVR 16FR STRL (SET/KITS/TRAYS/PACK) ×1 IMPLANT
TRAY FOLEY W/BAG SLVR 16FR LF (SET/KITS/TRAYS/PACK) ×2
TUBE CONNECTING 12X1/4 (SUCTIONS) ×2 IMPLANT
UNDERPAD 30X36 HEAVY ABSORB (UNDERPADS AND DIAPERS) ×2 IMPLANT

## 2020-09-15 NOTE — Discharge Instructions (Signed)
09/15/2020  Return to work: 4 weeks if applicable  Today Dr. Denman George removed your uterus, cervix, both fallopian tubes and ovaries. On frozen section when the pathologist looked at the cyst and the lining of the uterus under the microscope, he felt that everything was benign (no cancer). We will contact you when the final pathology report is available.  Activity: 1. Be up and out of the bed during the day.  Take a nap if needed.  You may walk up steps but be careful and use the hand rail.  Stair climbing will tire you more than you think, you may need to stop part way and rest.   2. No lifting or straining for 4-6 weeks.     3. No driving for around 1 week(s).  Do not drive if you are taking narcotic pain medicine.  4. Shower daily.  Use soap and water on your incision and pat dry; don't rub.  No tub baths until cleared by your surgeon.   5. No sexual activity and nothing in the vagina for 8 weeks.  6. You may experience a small amount of clear drainage from your incisions, which is normal.  If the drainage persists or increases, please call the office.  7. You may experience vaginal spotting after surgery or around the 6-8 week mark from surgery when the stitches at the top of the vagina begin to dissolve.  The spotting is normal but if you experience heavy bleeding, call our office.  8. Take Tylenol or ibuprofen first for pain and only use narcotic pain medication for severe pain not relieved by the Tylenol or Ibuprofen.  Monitor your Tylenol intake to a max of 4,000 mg.  Diet: 1. Low sodium Heart Healthy Diet is recommended.  2. It is safe to use a laxative, such as Miralax or Colace, if you have difficulty moving your bowels. You can take Sennakot at bedtime every evening to keep bowel movements regular and to prevent constipation.    Wound Care: 1. Keep clean and dry.  Shower daily.  Reasons to call the Doctor:  Fever - Oral temperature greater than 100.4 degrees  Fahrenheit  Foul-smelling vaginal discharge  Difficulty urinating  Nausea and vomiting  Increased pain at the site of the incision that is unrelieved with pain medicine.  Difficulty breathing with or without chest pain  New calf pain especially if only on one side  Sudden, continuing increased vaginal bleeding with or without clots.   Contacts: For questions or concerns you should contact:  Dr. Everitt Amber at [phone]  Joylene John, NP at [phone]  After Hours: call [phone] and have the GYN Oncologist paged/contacted  Do not take any tylenol until after 1:00 pm today.  Post Anesthesia Home Care Instructions  Activity: Get plenty of rest for the remainder of the day. A responsible individual must stay with you for 24 hours following the procedure.  For the next 24 hours, DO NOT: -Drive a car -Paediatric nurse -Drink alcoholic beverages -Take any medication unless instructed by your physician -Make any legal decisions or sign important papers.  Meals: Start with liquid foods such as gelatin or soup. Progress to regular foods as tolerated. Avoid greasy, spicy, heavy foods. If nausea and/or vomiting occur, drink only clear liquids until the nausea and/or vomiting subsides. Call your physician if vomiting continues.  Special Instructions/Symptoms: Your throat may feel dry or sore from the anesthesia or the breathing tube placed in your throat during surgery. If this causes discomfort,  gargle with warm salt water. The discomfort should disappear within 24 hours.  If you had a scopolamine patch placed behind your ear for the management of post- operative nausea and/or vomiting:  1. The medication in the patch is effective for 72 hours, after which it should be removed.  Wrap patch in a tissue and discard in the trash. Wash hands thoroughly with soap and water. 2. You may remove the patch earlier than 72 hours if you experience unpleasant side effects which may  include dry mouth, dizziness or visual disturbances. 3. Avoid touching the patch. Wash your hands with soap and water after contact with the patch.

## 2020-09-15 NOTE — Transfer of Care (Signed)
Immediate Anesthesia Transfer of Care Note  Patient: Adalberto Cole  Procedure(s) Performed: XI ROBOTIC ASSISTED TOTAL HYSTERECTOMY WITH BILATERAL SALPINGO OOPHORECTOMY (N/A Abdomen)  Patient Location: PACU  Anesthesia Type:General  Level of Consciousness: awake, alert , oriented and patient cooperative  Airway & Oxygen Therapy: Patient Spontanous Breathing and Patient connected to nasal cannula oxygen  Post-op Assessment: Report given to RN and Post -op Vital signs reviewed and stable  Post vital signs: Reviewed and stable  Last Vitals:  Vitals Value Taken Time  BP    Temp    Pulse 87 09/15/20 0959  Resp 17 09/15/20 0959  SpO2 91 % 09/15/20 0959  Vitals shown include unvalidated device data.  Last Pain:  Vitals:   09/15/20 0700  TempSrc: Oral  PainSc: 0-No pain      Patients Stated Pain Goal: 7 (83/37/44 5146)  Complications: No complications documented.

## 2020-09-15 NOTE — Anesthesia Procedure Notes (Signed)
Procedure Name: Intubation Date/Time: 09/15/2020 8:34 AM Performed by: Georgeanne Nim, CRNA Pre-anesthesia Checklist: Patient identified, Emergency Drugs available, Suction available, Patient being monitored and Timeout performed Patient Re-evaluated:Patient Re-evaluated prior to induction Oxygen Delivery Method: Circle system utilized Preoxygenation: Pre-oxygenation with 100% oxygen Induction Type: IV induction Ventilation: Mask ventilation without difficulty Laryngoscope Size: Mac and 4 Grade View: Grade I Number of attempts: 1 Airway Equipment and Method: Stylet Secured at: 20 cm Tube secured with: Tape Dental Injury: Teeth and Oropharynx as per pre-operative assessment

## 2020-09-15 NOTE — Interval H&P Note (Signed)
History and Physical Interval Note:  09/15/2020 7:09 AM  Brittany Lawson  has presented today for surgery, with the diagnosis of LEFT OVARIAN CYST.  The various methods of treatment have been discussed with the patient and family. After consideration of risks, benefits and other options for treatment, the patient has consented to  Procedure(s): XI ROBOTIC ASSISTED TOTAL HYSTERECTOMY WITH BILATERAL SALPINGO OOPHORECTOMY WITH POSSIBLE STAGING (N/A) as a surgical intervention.  The patient's history has been reviewed, patient examined, no change in status, stable for surgery.  I have reviewed the patient's chart and labs.  Questions were answered to the patient's satisfaction.     Thereasa Solo

## 2020-09-15 NOTE — Op Note (Signed)
OPERATIVE NOTE 09/15/20  Surgeon: Donaciano Eva   Assistants: Joylene John, NP (a provider assistant was necessary for tissue manipulation, management of robotic instrumentation, retraction and positioning due to the complexity of the case and hospital policies).   Anesthesia: General endotracheal anesthesia  ASA Class: 3   Pre-operative Diagnosis: left ovarian cyst, thickened endometrium  Post-operative Diagnosis: same and benign pathology  Operation: Robotic-assisted laparoscopic total hysterectomy with bilateral salpingoophorectomy   Surgeon: Donaciano Eva  Assistant Surgeon: Joylene John, NP  Anesthesia: GET  Urine Output: 100cc  Operative Findings:  : 6cm uterus, normal right tube and ovary and appendix, 8cm simple appearing left ovarian cystic mass. Normal upper adomen, omental adhesions to upper midline epigastrium.   Estimated Blood Loss:  10cc      Total IV Fluids: 800 ml         Specimens: washings, uterus with cervix and bilateral tubes and ovaries         Complications:  None; patient tolerated the procedure well.         Disposition: PACU - hemodynamically stable.  Procedure Details  The patient was seen in the Holding Room. The risks, benefits, complications, treatment options, and expected outcomes were discussed with the patient.  The patient concurred with the proposed plan, giving informed consent.  The site of surgery properly noted/marked. The patient was identified as Brittany Lawson and the procedure verified as a Robotic-assisted hysterectomy with bilateral salpingo oophorectomy. A Time Out was held and the above information confirmed.  After induction of anesthesia, the patient was draped and prepped in the usual sterile manner. Pt was placed in supine position after anesthesia and draped and prepped in the usual sterile manner. The abdominal drape was placed after the CholoraPrep had been allowed to dry for 3 minutes.  Her arms were tucked  to her side with all appropriate precautions.  The shoulders were stabilized with padded shoulder blocks applied to the acromium processes.  The patient was placed in the semi-lithotomy position in [place].  The perineum was prepped with Betadine. The patient was then prepped. Foley catheter was placed.  A sterile speculum was placed in the vagina.  The cervix was grasped with a single-tooth tenaculum and dilated with Kennon Rounds dilators.  The ZUMI uterine manipulator with a medium colpotomizer ring was placed without difficulty.  A pneum occluder balloon was placed over the manipulator.  OG tube placement was confirmed and to suction.   Next, a 5 mm skin incision was made 1 cm below the subcostal margin in the midclavicular line.  The 5 mm Optiview port and scope was used for direct entry.  Opening pressure was under 10 mm CO2.  The abdomen was insufflated and the findings were noted as above.   At this point and all points during the procedure, the patient's intra-abdominal pressure did not exceed 15 mmHg. Next, a 10 mm skin incision was made in the umbilicus and a right and left port was placed about 10 cm lateral to the robot port on the right and left side.   All ports were placed under direct visualization.  The patient was placed in steep Trendelenburg.  Bowel was folded away into the upper abdomen.  The robot was docked in the normal manner.  The hysterectomy was started after the round ligament on the right side was incised and the retroperitoneum was entered and the pararectal space was developed.  The ureter was noted to be on the medial leaf of the  broad ligament.  The peritoneum above the ureter was incised and stretched and the infundibulopelvic ligament was skeletonized, cauterized and cut.  The posterior peritoneum was taken down to the level of the KOH ring.  The anterior peritoneum was also taken down.  The bladder flap was created to the level of the KOH ring.  The uterine artery on the  right side was skeletonized, cauterized and cut in the normal manner.  A similar procedure was performed on the left.  The colpotomy was made and the uterus, cervix, bilateral ovaries and tubes were amputated and delivered through the vagina.  Pedicles were inspected and excellent hemostasis was achieved.    Frozen section was benign for both left ovary and endometrium.   The colpotomy at the vaginal cuff was closed with Vicryl on a CT1 needle in figure of 8's at the corners and 0-v-loc through the mid cuff.  Irrigation was used and excellent hemostasis was achieved.  At this point in the procedure was completed.  Robotic instruments were removed under direct visulaization.  The robot was undocked. The 10 mm ports were closed with Vicryl on a UR-5 needle and the fascia was closed with 0 Vicryl on a UR-5 needle.  The skin was closed with 4-0 Vicryl in a subcuticular manner.  Dermabond was applied.  Sponge, lap and needle counts correct x 2.  The patient was taken to the recovery room in stable condition.  The vagina was swabbed with  minimal bleeding noted.   All instrument and needle counts were correct x  3.   The patient was transferred to the recovery room in a stable condition.  Donaciano Eva, MD

## 2020-09-15 NOTE — Anesthesia Preprocedure Evaluation (Addendum)
Anesthesia Evaluation  Patient identified by MRN, date of birth, ID band Patient awake    Reviewed: Allergy & Precautions, H&P , NPO status , Patient's Chart, lab work & pertinent test results  Airway Mallampati: II  TM Distance: >3 FB Neck ROM: Full    Dental  (+) Upper Dentures   Pulmonary asthma , former smoker,    Pulmonary exam normal breath sounds clear to auscultation       Cardiovascular hypertension, negative cardio ROS Normal cardiovascular exam Rhythm:Regular Rate:Normal  13-Sep-2020 09:27:19 [facility]-PRE ROUTINE RECORD Normal sinus rhythm Left axis deviation Abnormal ECG   Neuro/Psych PSYCHIATRIC DISORDERS Anxiety Depression negative neurological ROS     GI/Hepatic Neg liver ROS, GERD  ,  Endo/Other  negative endocrine ROS  Renal/GU negative Renal ROS  negative genitourinary   Musculoskeletal negative musculoskeletal ROS (+)   Abdominal   Peds negative pediatric ROS (+)  Hematology negative hematology ROS (+)   Anesthesia Other Findings   Reproductive/Obstetrics negative OB ROS                           Anesthesia Physical Anesthesia Plan  ASA: II  Anesthesia Plan: General   Post-op Pain Management:    Induction: Intravenous  PONV Risk Score and Plan: 3 and Scopolamine patch - Pre-op, Treatment may vary due to age or medical condition, Midazolam, Dexamethasone and Ondansetron  Airway Management Planned: Oral ETT  Additional Equipment: None  Intra-op Plan:   Post-operative Plan: Extubation in OR  Informed Consent: I have reviewed the patients History and Physical, chart, labs and discussed the procedure including the risks, benefits and alternatives for the proposed anesthesia with the patient or authorized representative who has indicated his/her understanding and acceptance.     Dental advisory given  Plan Discussed with: CRNA,  Anesthesiologist and Surgeon  Anesthesia Plan Comments:         Anesthesia Quick Evaluation

## 2020-09-15 NOTE — Anesthesia Postprocedure Evaluation (Signed)
Anesthesia Post Note  Patient: Brittany Lawson  Procedure(s) Performed: XI ROBOTIC ASSISTED TOTAL HYSTERECTOMY WITH BILATERAL SALPINGO OOPHORECTOMY (N/A Abdomen)     Patient location during evaluation: PACU Anesthesia Type: General Level of consciousness: awake Pain management: pain level controlled Vital Signs Assessment: post-procedure vital signs reviewed and stable Respiratory status: spontaneous breathing and respiratory function stable Cardiovascular status: stable Postop Assessment: no apparent nausea or vomiting Anesthetic complications: no   No complications documented.  Last Vitals:  Vitals:   09/15/20 1056 09/15/20 1100  BP:  134/77  Pulse: 90 96  Resp: 17 15  Temp:    SpO2: 100% (!) 87%    Last Pain:  Vitals:   09/15/20 1145  TempSrc:   PainSc: 4                  Candra R Tsuyako Jolley

## 2020-09-16 ENCOUNTER — Telehealth: Payer: Self-pay

## 2020-09-16 ENCOUNTER — Encounter (HOSPITAL_BASED_OUTPATIENT_CLINIC_OR_DEPARTMENT_OTHER): Payer: Self-pay | Admitting: Gynecologic Oncology

## 2020-09-16 LAB — CYTOLOGY - NON PAP

## 2020-09-16 LAB — SURGICAL PATHOLOGY

## 2020-09-16 NOTE — Telephone Encounter (Signed)
Brittany Lawson states that she is eating,drinking, and urinating well. Passing gas.Told her to increase the senokot-s to 2 tabs bid with 8 oz of water. If no BM by Saturday am, she can add 1 capful of Miralax bid. Afebrile. Incisions D&I Pt aware of post op appointments as well as the office number [phone] and after hours number [phone] to call if she has any questions or concerns

## 2020-09-17 ENCOUNTER — Telehealth: Payer: Self-pay | Admitting: *Deleted

## 2020-09-17 NOTE — Telephone Encounter (Signed)
I spoke with Ms. Brittany Lawson regarding her final surgical pathology. She was notified that benign polyp was seen in her endometrium.No precancer or cancer was seen. She is to f/u as scheduled on 4/1 and call with any questions or concerns.

## 2020-10-13 ENCOUNTER — Encounter: Payer: Self-pay | Admitting: Gynecologic Oncology

## 2020-10-15 ENCOUNTER — Inpatient Hospital Stay: Payer: Medicare Other

## 2020-10-15 ENCOUNTER — Inpatient Hospital Stay: Payer: Medicare Other | Attending: Gynecologic Oncology | Admitting: Gynecologic Oncology

## 2020-10-15 ENCOUNTER — Encounter: Payer: Self-pay | Admitting: Gynecologic Oncology

## 2020-10-15 ENCOUNTER — Telehealth: Payer: Self-pay

## 2020-10-15 ENCOUNTER — Other Ambulatory Visit: Payer: Self-pay

## 2020-10-15 VITALS — BP 131/54 | HR 79 | Temp 97.5°F | Resp 16 | Ht 65.0 in | Wt 183.2 lb

## 2020-10-15 DIAGNOSIS — Z87891 Personal history of nicotine dependence: Secondary | ICD-10-CM | POA: Diagnosis not present

## 2020-10-15 DIAGNOSIS — Z90722 Acquired absence of ovaries, bilateral: Secondary | ICD-10-CM | POA: Diagnosis not present

## 2020-10-15 DIAGNOSIS — R102 Pelvic and perineal pain: Secondary | ICD-10-CM | POA: Diagnosis present

## 2020-10-15 DIAGNOSIS — Z90710 Acquired absence of both cervix and uterus: Secondary | ICD-10-CM | POA: Diagnosis not present

## 2020-10-15 DIAGNOSIS — N83202 Unspecified ovarian cyst, left side: Secondary | ICD-10-CM

## 2020-10-15 DIAGNOSIS — Z79899 Other long term (current) drug therapy: Secondary | ICD-10-CM | POA: Insufficient documentation

## 2020-10-15 DIAGNOSIS — R5082 Postprocedural fever: Secondary | ICD-10-CM

## 2020-10-15 DIAGNOSIS — N83201 Unspecified ovarian cyst, right side: Secondary | ICD-10-CM | POA: Diagnosis present

## 2020-10-15 LAB — CBC WITH DIFFERENTIAL (CANCER CENTER ONLY)
Abs Immature Granulocytes: 0.04 10*3/uL (ref 0.00–0.07)
Basophils Absolute: 0.1 10*3/uL (ref 0.0–0.1)
Basophils Relative: 1 %
Eosinophils Absolute: 0.9 10*3/uL — ABNORMAL HIGH (ref 0.0–0.5)
Eosinophils Relative: 8 %
HCT: 37.3 % (ref 36.0–46.0)
Hemoglobin: 11.8 g/dL — ABNORMAL LOW (ref 12.0–15.0)
Immature Granulocytes: 0 %
Lymphocytes Relative: 18 %
Lymphs Abs: 2.1 10*3/uL (ref 0.7–4.0)
MCH: 26.5 pg (ref 26.0–34.0)
MCHC: 31.6 g/dL (ref 30.0–36.0)
MCV: 83.6 fL (ref 80.0–100.0)
Monocytes Absolute: 1 10*3/uL (ref 0.1–1.0)
Monocytes Relative: 9 %
Neutro Abs: 7.2 10*3/uL (ref 1.7–7.7)
Neutrophils Relative %: 64 %
Platelet Count: 264 10*3/uL (ref 150–400)
RBC: 4.46 MIL/uL (ref 3.87–5.11)
RDW: 13 % (ref 11.5–15.5)
WBC Count: 11.3 10*3/uL — ABNORMAL HIGH (ref 4.0–10.5)
nRBC: 0 % (ref 0.0–0.2)

## 2020-10-15 MED ORDER — AMOXICILLIN-POT CLAVULANATE 875-125 MG PO TABS: 1.0000 | ORAL_TABLET | Freq: Two times a day (BID) | ORAL | 0 refills | Status: DC

## 2020-10-15 NOTE — Telephone Encounter (Signed)
Brittany Lawson states that is feels tired.  Emotionally labile the last couple of weeks. Crying a lot. She has been taking care of her grandchildren  She resumed caring for her grandchildren 10 days after her surgery on 09-15-20.  She does not pick them up but is is very busy with them. Temp today 99.7 No shaking chills. Denies urinary symptoms, cough ,chest congestion. Reviewed with Joylene John, NP. Pt should keep appointment today with Dr. Denman George.

## 2020-10-15 NOTE — Patient Instructions (Signed)
Dr Denman George suspects that the incision at the top of the vagina has some infection in it which is why you have increased tenderness there and a fever.  She has prescribed an antibiotic to take twice a day for 2 weeks.   Please avoid sex for another 6 weeks.   You can follow-up long term Stann Mainland as needed for gynecologic care.

## 2020-10-15 NOTE — Progress Notes (Signed)
Follow-up Note: Gyn-Onc  Consult was initially requested by Dr. Stann Mainland for the evaluation of Brittany Lawson 65 y.o. female  CC:  Chief Complaint  Patient presents with  . Left ovarian cyst    Assessment/Plan:  Brittany Lawson  is a 65 y.o.  year old who is s/p robotic assisted total hysterectomy with BSO on September 15, 2020 for a left ovarian cyst and thickened endometrium.  She had benign pathology on definitive pathology and therefore does not require ongoing follow-up.  She has low-grade fevers and tenderness at the vaginal cuff suspicious for vaginal cuff cellulitis therefore I will empirically treat her with antibiotics with Augmentin for 2 weeks.  Recommend pelvic rest for another 6 weeks.  She will follow up with her gynecologist for long-term care.   HPI: Brittany Lawson is a 65 year old P4 who was seen in consultation at the request of Dr Stann Mainland for evaluation of an 8cm left ovarian cyst.  The patient experienced an episode of left flank pain on March 31, 2020 which resulted in her being seen in the [facility] emergency department for further evaluation.  A CT scan without contrast was performed at that time and the images from this were reviewed by me.  It revealed a left sided 82mm renal stone causing mild hydroureteronephrosis.  An incidental finding of a 4.9 cm left adnexal cyst was also identified.  The uterus and right adnexa were normal.  Recommendation was made for follow-up imaging of the cyst in 6 to 12 months.  The patient saw Dr. Para March in February 2022 for follow-up at that time underwent a transvaginal ultrasound scan on 08/20/2020 which showed a uterus measuring 8.6 x 2.6 x 3.7 cm.  It was retroverted.  The endometrium was inadequately visualized but heterogeneous and appeared to measure the 10 mm in thickness.  The right ovary was not visualized.  The left ovary measured 9.3 x 6.2 x 6.6 cm and contain a complex cyst measuring 8.3 x 6 x 5.3 cm with a thick irregular  septation greater than 3 mm.  There was a few scattered internal echoes.  Interval Hx:  On September 15, 2020 she underwent robotic assisted total hysterectomy with BSO. Intraoperative findings were significant for a 6 cm uterus with normal right tube and ovary and appendix.  An 8 cm simple appearing left ovarian cyst.  Normal upper abdomen omental adhesions to the upper midline epigastrium.  Frozen section for the endometrium and ovary were benign. Surgery was uncomplicated.  Final pathology revealed a benign cystadenoma and benign endometrium.  Since surgery she has done well except for some low-grade fevers that began 2 weeks postoperatively.  T-max was 101.  She denies vaginal bleeding or drainage.   Current Meds:  Outpatient Encounter Medications as of 10/15/2020  Medication Sig  . amoxicillin-clavulanate (AUGMENTIN) 875-125 MG tablet Take 1 tablet by mouth 2 (two) times daily.  Marland Kitchen ibuprofen (ADVIL) 800 MG tablet Take 1 tablet (800 mg total) by mouth every 8 (eight) hours as needed for moderate pain. For AFTER surgery only  . losartan (COZAAR) 100 MG tablet TAKE 1 TABLET BY MOUTH DAILY (Patient taking differently: at bedtime.)  . meclizine (ANTIVERT) 25 MG tablet Take 25 mg by mouth 3 (three) times daily as needed for dizziness.  . montelukast (SINGULAIR) 10 MG tablet TAKE 1 TABLET BY MOUTH DAILY (Patient taking differently: at bedtime.)  . pantoprazole (PROTONIX) 40 MG tablet TAKE 1 TABLET BY MOUTH TWICE A DAY (Patient  taking differently: 2 (two) times daily. Takes in am, prn in pm)  . senna-docusate (SENOKOT-S) 8.6-50 MG tablet Take 2 tablets by mouth at bedtime. For AFTER surgery, do not take if having diarrhea  . traZODone (DESYREL) 50 MG tablet TAKE 1 TABLET BY MOUTH AT BEDTIME (Patient taking differently: at bedtime.)  . venlafaxine XR (EFFEXOR-XR) 75 MG 24 hr capsule TAKE 1 CAPSULE BY MOUTH 2 TIMES DAILY (Patient taking differently: 75 mg at bedtime. Takes 2 tabs of 75 mg qhs)  . albuterol  (VENTOLIN HFA) 108 (90 Base) MCG/ACT inhaler 1-2 puffs every 4 (four) hours as needed.  . ALPRAZolam (XANAX) 0.5 MG tablet 1 po qd prn anxiety  . amLODipine (NORVASC) 5 MG tablet TAKE 1 TABLET BY MOUTH DAILY (Patient taking differently: at bedtime.)  . Fluticasone-Salmeterol (ADVAIR DISKUS) 250-50 MCG/DOSE AEPB Inhale 1 puff into the lungs in the morning and at bedtime. (Patient taking differently: Inhale 1 puff into the lungs 2 (two) times daily as needed.)  . traMADol (ULTRAM) 50 MG tablet Take 1 tablet (50 mg total) by mouth every 6 (six) hours as needed for severe pain. For AFTER surgery only, do not take and drive (Patient not taking: Reported on 10/13/2020)   No facility-administered encounter medications on file as of 10/15/2020.    Allergy:  Allergies  Allergen Reactions  . Sulfa Antibiotics Nausea Only and Other (See Comments)    Childhood reaction    Social Hx:   Social History   Socioeconomic History  . Marital status: Married    Spouse name: Not on file  . Number of children: 4  . Years of education: Not on file  . Highest education level: Not on file  Occupational History  . Occupation: homemaker  Tobacco Use  . Smoking status: Former Smoker    Packs/day: 1.50    Years: 20.00    Pack years: 30.00    Types: Cigarettes  . Smokeless tobacco: Never Used  . Tobacco comment: quit 25 yrs ago  Vaping Use  . Vaping Use: Never used  Substance and Sexual Activity  . Alcohol use: Not Currently  . Drug use: Never  . Sexual activity: Not Currently  Other Topics Concern  . Not on file  Social History Narrative  . Not on file   Social Determinants of Health   Financial Resource Strain: Not on file  Food Insecurity: Not on file  Transportation Needs: Not on file  Physical Activity: Not on file  Stress: Not on file  Social Connections: Not on file  Intimate Partner Violence: Not on file    Past Surgical Hx:  Past Surgical History:  Procedure Laterality Date  .  AUGMENTATION MAMMAPLASTY Bilateral age 39's age 18's     each redone 2018 right side calcification removed  . BREAST EXCISIONAL BIOPSY Right    APPROX 20 YEARS AGO  . CHOLECYSTECTOMY  age 9   [facility]  . colonscopy  10-2019 and 05-2020   polyp removed both times due may 2022  . ROBOTIC ASSISTED TOTAL HYSTERECTOMY WITH BILATERAL SALPINGO OOPHERECTOMY N/A 09/15/2020   Procedure: XI ROBOTIC ASSISTED TOTAL HYSTERECTOMY WITH BILATERAL SALPINGO OOPHORECTOMY;  Surgeon: Everitt Amber, MD;  Location: [facility];  Service: Gynecology;  Laterality: N/A;  . TUBAL LIGATION  age 2  . UPPER GI ENDOSCOPY  05/2020    Past Medical Hx:  Past Medical History:  Diagnosis Date  . Asthma   . Blood clot in vein yrs ago   x 2 right  leg  . Colon polyps 2019  . Constipation   . Depression   . GERD (gastroesophageal reflux disease)   . History of kidney stones May 03, 2020   "passed on own"  . Hypertension   . Left ovarian cyst   . Varicose veins of both lower extremities     Past Gynecological History:  See HPI No LMP recorded. Patient is postmenopausal.  Family Hx:  Family History  Problem Relation Age of Onset  . Breast cancer Mother 59    Review of Systems:  Constitutional  Feels well,   ENT Normal appearing ears and nares bilaterally Skin/Breast  No rash, sores, jaundice, itching, dryness Cardiovascular  No chest pain, shortness of breath, or edema  Pulmonary  No cough or wheeze.  Gastro Intestinal  No nausea, vomitting, or diarrhoea. No bright red blood per rectum, no abdominal pain, change in bowel movement, or constipation.  Genito Urinary  No frequency, urgency, dysuria, no bleeding,  Musculo Skeletal  No myalgia, arthralgia, joint swelling or pain  Neurologic  No weakness, numbness, change in gait,  Psychology  No depression, anxiety, insomnia.   Vitals:  Blood pressure (!) 131/54, pulse 79, temperature (!) 97.5 F (36.4 C), temperature source Tympanic, resp. rate 16,  height 5\' 5"  (1.651 m), weight 183 lb 3.2 oz (83.1 kg), SpO2 100 %.  Physical Exam: Abdominal incisions: well healed, no tenderness Vaginal cuff in tact, no drainage, + firmness and tenderness to palpate  Thereasa Solo, MD  10/15/2020, 1:33 PM

## 2020-10-25 ENCOUNTER — Telehealth: Payer: Self-pay | Admitting: *Deleted

## 2020-10-25 NOTE — Telephone Encounter (Signed)
Brittany Lawson called to reports some pinkish vaginal discharge when she wipes. She thinks the discharge started a few days after her appt. on 4/1. She is taking her ABX as prescribed. She denies pain or fever. I spoke with Joylene John NP and no intervention is needed at this time. She should call if the bleeding increase, if she develops pain,fever or has any other questions or concerns.

## 2020-11-05 ENCOUNTER — Other Ambulatory Visit: Payer: Self-pay | Admitting: Obstetrics & Gynecology

## 2020-11-05 DIAGNOSIS — Z1231 Encounter for screening mammogram for malignant neoplasm of breast: Secondary | ICD-10-CM

## 2020-11-08 ENCOUNTER — Other Ambulatory Visit: Payer: Self-pay | Admitting: Family Medicine

## 2020-11-16 ENCOUNTER — Encounter: Payer: Self-pay | Admitting: Physician Assistant

## 2020-11-16 LAB — HM COLONOSCOPY

## 2020-11-18 ENCOUNTER — Ambulatory Visit: Payer: Medicare Other

## 2020-11-24 ENCOUNTER — Other Ambulatory Visit: Payer: Self-pay

## 2020-11-24 ENCOUNTER — Ambulatory Visit (INDEPENDENT_AMBULATORY_CARE_PROVIDER_SITE_OTHER): Payer: Medicare Other

## 2020-11-24 VITALS — BP 132/68 | HR 88 | Temp 98.7°F | Resp 16 | Ht 65.0 in | Wt 180.0 lb

## 2020-11-24 DIAGNOSIS — Z6829 Body mass index (BMI) 29.0-29.9, adult: Secondary | ICD-10-CM | POA: Diagnosis not present

## 2020-11-24 DIAGNOSIS — Z0000 Encounter for general adult medical examination without abnormal findings: Secondary | ICD-10-CM

## 2020-11-24 DIAGNOSIS — Z780 Asymptomatic menopausal state: Secondary | ICD-10-CM | POA: Diagnosis not present

## 2020-11-24 NOTE — Patient Instructions (Signed)
 Bone Density Test A bone density test uses a type of X-ray to measure the amount of calcium and other minerals in a person's bones. It can measure bone density in the hip and the spine. The test is similar to having a regular X-ray. This test may also be called:  Bone densitometry.  Bone mineral density test.  Dual-energy X-ray absorptiometry (DEXA). You may have this test to:  Diagnose a condition that causes weak or thin bones (osteoporosis).  Screen you for osteoporosis.  Predict your risk for a broken bone (fracture).  Determine how well your osteoporosis treatment is working. Tell a health care provider about:  Any allergies you have.  All medicines you are taking, including vitamins, herbs, eye drops, creams, and over-the-counter medicines.  Any problems you or family members have had with anesthetic medicines.  Any blood disorders you have.  Any surgeries you have had.  Any medical conditions you have.  Whether you are pregnant or may be pregnant.  Any medical tests you have had within the past 14 days that used contrast material. What are the risks? Generally, this is a safe test. However, it does expose you to a small amount of radiation, which can slightly increase your cancer risk. What happens before the test?  Do not take any calcium supplements within the 24 hours before your test.  You will need to remove all metal jewelry, eyeglasses, removable dental appliances, and any other metal objects on your body. What happens during the test?  You will lie down on an exam table. There will be an X-ray generator below you and an imaging device above you.  Other devices, such as boxes or braces, may be used to position your body properly for the scan.  The machine will slowly scan your body. You will need to keep very still while the machine does the scan.  The images will show up on a screen in the room. Images will be examined by a specialist after your  test is finished. The procedure may vary among health care providers and hospitals.   What can I expect after the test? It is up to you to get the results of your test. Ask your health care provider, or the department that is doing the test, when your results will be ready. Summary  A bone density test is an imaging test that uses a type of X-ray to measure the amount of calcium and other minerals in your bones.  The test may be used to diagnose or screen you for a condition that causes weak or thin bones (osteoporosis), predict your risk for a broken bone (fracture), or determine how well your osteoporosis treatment is working.  Do not take any calcium supplements within 24 hours before your test.  Ask your health care provider, or the department that is doing the test, when your results will be ready. This information is not intended to replace advice given to you by your health care provider. Make sure you discuss any questions you have with your health care provider. Document Revised: 12/18/2019 Document Reviewed: 12/18/2019 Elsevier Patient Education  2021 [employer]   Fall Prevention in the Home, Adult Falls can cause injuries and can happen to people of all ages. There are many things you can do to make your home safe and to help prevent falls. Ask for help when making these changes. What actions can I take to prevent falls? General Instructions  Use good lighting in all rooms. Replace   any light bulbs that burn out.  Turn on the lights in dark areas. Use night-lights.  Keep items that you use often in easy-to-reach places. Lower the shelves around your home if needed.  Set up your furniture so you have a clear path. Avoid moving your furniture around.  Do not have throw rugs or other things on the floor that can make you trip.  Avoid walking on wet floors.  If any of your floors are uneven, fix them.  Add color or contrast paint or tape to clearly mark and help you  see: ? Grab bars or handrails. ? First and last steps of staircases. ? Where the edge of each step is.  If you use a stepladder: ? Make sure that it is fully opened. Do not climb a closed stepladder. ? Make sure the sides of the stepladder are locked in place. ? Ask someone to hold the stepladder while you use it.  Know where your pets are when moving through your home. What can I do in the bathroom?  Keep the floor dry. Clean up any water on the floor right away.  Remove soap buildup in the tub or shower.  Use nonskid mats or decals on the floor of the tub or shower.  Attach bath mats securely with double-sided, nonslip rug tape.  If you need to sit down in the shower, use a plastic, nonslip stool.  Install grab bars by the toilet and in the tub and shower. Do not use towel bars as grab bars.      What can I do in the bedroom?  Make sure that you have a light by your bed that is easy to reach.  Do not use any sheets or blankets for your bed that hang to the floor.  Have a firm chair with side arms that you can use for support when you get dressed. What can I do in the kitchen?  Clean up any spills right away.  If you need to reach something above you, use a step stool with a grab bar.  Keep electrical cords out of the way.  Do not use floor polish or wax that makes floors slippery. What can I do with my stairs?  Do not leave any items on the stairs.  Make sure that you have a light switch at the top and the bottom of the stairs.  Make sure that there are handrails on both sides of the stairs. Fix handrails that are broken or loose.  Install nonslip stair treads on all your stairs.  Avoid having throw rugs at the top or bottom of the stairs.  Choose a carpet that does not hide the edge of the steps on the stairs.  Check carpeting to make sure that it is firmly attached to the stairs. Fix carpet that is loose or worn. What can I do on the outside of my  home?  Use bright outdoor lighting.  Fix the edges of walkways and driveways and fix any cracks.  Remove anything that might make you trip as you walk through a door, such as a raised step or threshold.  Trim any bushes or trees on paths to your home.  Check to see if handrails are loose or broken and that both sides of all steps have handrails.  Install guardrails along the edges of any raised decks and porches.  Clear paths of anything that can make you trip, such as tools or rocks.  Have leaves, snow, or   ice cleared regularly.  Use sand or salt on paths during winter.  Clean up any spills in your garage right away. This includes grease or oil spills. What other actions can I take?  Wear shoes that: ? Have a low heel. Do not wear high heels. ? Have rubber bottoms. ? Feel good on your feet and fit well. ? Are closed at the toe. Do not wear open-toe sandals.  Use tools that help you move around if needed. These include: ? Canes. ? Walkers. ? Scooters. ? Crutches.  Review your medicines with your doctor. Some medicines can make you feel dizzy. This can increase your chance of falling. Ask your doctor what else you can do to help prevent falls. Where to find more information  Centers for Disease Control and Prevention, [employer]: www.cdc.gov  National Institute on Aging: www.nia.nih.gov Contact a doctor if:  You are afraid of falling at home.  You feel weak, drowsy, or dizzy at home.  You fall at home. Summary  There are many simple things that you can do to make your home safe and to help prevent falls.  Ways to make your home safe include removing things that can make you trip and installing grab bars in the bathroom.  Ask for help when making these changes in your home. This information is not intended to replace advice given to you by your health care provider. Make sure you discuss any questions you have with your health care provider. Document Revised:  02/04/2020 Document Reviewed: 02/04/2020 Elsevier Patient Education  2021 [employer]   Health Maintenance, Female Adopting a healthy lifestyle and getting preventive care are important in promoting health and wellness. Ask your health care provider about:  The right schedule for you to have regular tests and exams.  Things you can do on your own to prevent diseases and keep yourself healthy. What should I know about diet, weight, and exercise? Eat a healthy diet  Eat a diet that includes plenty of vegetables, fruits, low-fat dairy products, and lean protein.  Do not eat a lot of foods that are high in solid fats, added sugars, or sodium.   Maintain a healthy weight Body mass index (BMI) is used to identify weight problems. It estimates body fat based on height and weight. Your health care provider can help determine your BMI and help you achieve or maintain a healthy weight. Get regular exercise Get regular exercise. This is one of the most important things you can do for your health. Most adults should:  Exercise for at least 150 minutes each week. The exercise should increase your heart rate and make you sweat (moderate-intensity exercise).  Do strengthening exercises at least twice a week. This is in addition to the moderate-intensity exercise.  Spend less time sitting. Even light physical activity can be beneficial. Watch cholesterol and blood lipids Have your blood tested for lipids and cholesterol at 66 years of age, then have this test every 5 years. Have your cholesterol levels checked more often if:  Your lipid or cholesterol levels are high.  You are older than 66 years of age.  You are at high risk for heart disease. What should I know about cancer screening? Depending on your health history and family history, you may need to have cancer screening at various ages. This may include screening for:  Breast cancer.  Cervical cancer.  Colorectal cancer.  Skin  cancer.  Lung cancer. What should I know about heart disease, diabetes, and high   blood pressure? Blood pressure and heart disease  High blood pressure causes heart disease and increases the risk of stroke. This is more likely to develop in people who have high blood pressure readings, are of African descent, or are overweight.  Have your blood pressure checked: ? Every 3-5 years if you are 18-39 years of age. ? Every year if you are 40 years old or older. Diabetes Have regular diabetes screenings. This checks your fasting blood sugar level. Have the screening done:  Once every three years after age 40 if you are at a normal weight and have a low risk for diabetes.  More often and at a younger age if you are overweight or have a high risk for diabetes. What should I know about preventing infection? Hepatitis B If you have a higher risk for hepatitis B, you should be screened for this virus. Talk with your health care provider to find out if you are at risk for hepatitis B infection. Hepatitis C Testing is recommended for:  Everyone born from 1945 through 1965.  Anyone with known risk factors for hepatitis C. Sexually transmitted infections (STIs)  Get screened for STIs, including gonorrhea and chlamydia, if: ? You are sexually active and are younger than 66 years of age. ? You are older than 66 years of age and your health care provider tells you that you are at risk for this type of infection. ? Your sexual activity has changed since you were last screened, and you are at increased risk for chlamydia or gonorrhea. Ask your health care provider if you are at risk.  Ask your health care provider about whether you are at high risk for HIV. Your health care provider may recommend a prescription medicine to help prevent HIV infection. If you choose to take medicine to prevent HIV, you should first get tested for HIV. You should then be tested every 3 months for as long as you are taking  the medicine. Pregnancy  If you are about to stop having your period (premenopausal) and you may become pregnant, seek counseling before you get pregnant.  Take 400 to 800 micrograms (mcg) of folic acid every day if you become pregnant.  Ask for birth control (contraception) if you want to prevent pregnancy. Osteoporosis and menopause Osteoporosis is a disease in which the bones lose minerals and strength with aging. This can result in bone fractures. If you are 65 years old or older, or if you are at risk for osteoporosis and fractures, ask your health care provider if you should:  Be screened for bone loss.  Take a calcium or vitamin D supplement to lower your risk of fractures.  Be given hormone replacement therapy (HRT) to treat symptoms of menopause. Follow these instructions at home: Lifestyle  Do not use any products that contain nicotine or tobacco, such as cigarettes, e-cigarettes, and chewing tobacco. If you need help quitting, ask your health care provider.  Do not use street drugs.  Do not share needles.  Ask your health care provider for help if you need support or information about quitting drugs. Alcohol use  Do not drink alcohol if: ? Your health care provider tells you not to drink. ? You are pregnant, may be pregnant, or are planning to become pregnant.  If you drink alcohol: ? Limit how much you use to 0-1 drink a day. ? Limit intake if you are breastfeeding.  Be aware of how much alcohol is in your drink. In the [place]   one drink equals one 12 oz bottle of beer (355 mL), one 5 oz glass of wine (148 mL), or one 1 oz glass of hard liquor (44 mL). General instructions  Schedule regular health, dental, and eye exams.  Stay current with your vaccines.  Tell your health care provider if: ? You often feel depressed. ? You have ever been abused or do not feel safe at home. Summary  Adopting a healthy lifestyle and getting preventive care are important in  promoting health and wellness.  Follow your health care provider's instructions about healthy diet, exercising, and getting tested or screened for diseases.  Follow your health care provider's instructions on monitoring your cholesterol and blood pressure. This information is not intended to replace advice given to you by your health care provider. Make sure you discuss any questions you have with your health care provider. Document Revised: 06/26/2018 Document Reviewed: 06/26/2018 Elsevier Patient Education  2021 [employer]  

## 2020-11-24 NOTE — Progress Notes (Signed)
Subjective:   Brittany Lawson is a 65 y.o. female who presents for Medicare Annual (Subsequent) preventive examination.  This wellness visit is conducted by a nurse.  The patient's medications were reviewed and reconciled since the patient's last visit.  History details were provided by the patient.  The history appears to be reliable.    Patient's last AWV was unknown. year ago.   Medical History: Patient history and Family history was reviewed  Medications, Allergies, and preventative health maintenance was reviewed and updated.   Review of Systems    Review of Systems  Constitutional: Negative.   HENT: Negative.   Eyes: Negative.   Respiratory: Negative.  Negative for cough and shortness of breath.   Cardiovascular: Negative.  Negative for chest pain and palpitations.  Gastrointestinal: Negative.   Genitourinary: Negative.   Musculoskeletal: Negative.  Negative for arthralgias and back pain.  Neurological: Positive for dizziness. Negative for numbness and headaches.       Vertigo at times  Psychiatric/Behavioral: Negative.  Negative for confusion, dysphoric mood and suicidal ideas. The patient is not nervous/anxious.    Cardiac Risk Factors include: advanced age (>38men, >86 women)     Objective:    Today's Vitals   11/24/20 1001  BP: 132/68  Pulse: 88  Resp: 16  Temp: 98.7 F (37.1 C)  SpO2: 98%  Weight: 180 lb (81.6 kg)  Height: 5\' 5"  (1.651 m)  PainSc: 0-No pain   Body mass index is 29.95 kg/m.  Advanced Directives 11/24/2020 10/13/2020 09/15/2020 08/25/2020  Does Patient Have a Medical Advance Directive? No No - No  Would patient like information on creating a medical advance directive? Yes (MAU/Ambulatory/Procedural Areas - Information given) No - Patient declined No - Patient declined No - Patient declined    Current Medications (verified) Outpatient Encounter Medications as of 11/24/2020  Medication Sig  . albuterol (VENTOLIN HFA) 108 (90 Base) MCG/ACT inhaler  1-2 puffs every 4 (four) hours as needed.  . ALPRAZolam (XANAX) 0.5 MG tablet 1 po qd prn anxiety  . amLODipine (NORVASC) 5 MG tablet TAKE 1 TABLET BY MOUTH DAILY  . amoxicillin-clavulanate (AUGMENTIN) 875-125 MG tablet Take 1 tablet by mouth 2 (two) times daily.  . Fluticasone-Salmeterol (ADVAIR DISKUS) 250-50 MCG/DOSE AEPB Inhale 1 puff into the lungs in the morning and at bedtime. (Patient taking differently: Inhale 1 puff into the lungs 2 (two) times daily as needed.)  . ibuprofen (ADVIL) 800 MG tablet Take 1 tablet (800 mg total) by mouth every 8 (eight) hours as needed for moderate pain. For AFTER surgery only  . losartan (COZAAR) 100 MG tablet Take 1 tablet (100 mg total) by mouth at bedtime.  . meclizine (ANTIVERT) 25 MG tablet Take 25 mg by mouth 3 (three) times daily as needed for dizziness.  . montelukast (SINGULAIR) 10 MG tablet TAKE 1 TABLET BY MOUTH DAILY  . pantoprazole (PROTONIX) 40 MG tablet TAKE 1 TABLET BY MOUTH TWICE A DAY  . senna-docusate (SENOKOT-S) 8.6-50 MG tablet Take 2 tablets by mouth at bedtime. For AFTER surgery, do not take if having diarrhea  . traMADol (ULTRAM) 50 MG tablet Take 1 tablet (50 mg total) by mouth every 6 (six) hours as needed for severe pain. For AFTER surgery only, do not take and drive  . traZODone (DESYREL) 50 MG tablet TAKE 1 TABLET BY MOUTH AT BEDTIME  . venlafaxine XR (EFFEXOR-XR) 75 MG 24 hr capsule TAKE 1 CAPSULE BY MOUTH 2 TIMES DAILY   No facility-administered encounter  medications on file as of 11/24/2020.    Allergies (verified) Sulfa antibiotics   History: Past Medical History:  Diagnosis Date  . Asthma   . Blood clot in vein yrs ago   x 2 right leg  . Colon polyps 2019  . Constipation   . Depression   . GERD (gastroesophageal reflux disease)   . History of kidney stones 04-16-20   "passed on own"  . Hypertension   . Left ovarian cyst   . Varicose veins of both lower extremities    Past Surgical History:  Procedure  Laterality Date  . AUGMENTATION MAMMAPLASTY Bilateral age 77's age 75's     each redone 2018 right side calcification removed  . BREAST EXCISIONAL BIOPSY Right    APPROX 20 YEARS AGO  . CHOLECYSTECTOMY  age 29   lapa  . colonscopy  10-2019 and 05-2020   polyp removed both times due may 2022  . ROBOTIC ASSISTED TOTAL HYSTERECTOMY WITH BILATERAL SALPINGO OOPHERECTOMY N/A 09/15/2020   Procedure: XI ROBOTIC ASSISTED TOTAL HYSTERECTOMY WITH BILATERAL SALPINGO OOPHORECTOMY;  Surgeon: Everitt Amber, MD;  Location: [facility];  Service: Gynecology;  Laterality: N/A;  . TUBAL LIGATION  age 60  . UPPER GI ENDOSCOPY  05/2020   Family History  Problem Relation Age of Onset  . Breast cancer Mother 37   Social History   Socioeconomic History  . Marital status: Married    Spouse name: AC  . Number of children: 4  Occupational History  . Occupation: homemaker  Tobacco Use  . Smoking status: Former Smoker    Packs/day: 1.50    Years: 20.00    Pack years: 30.00    Types: Cigarettes  . Smokeless tobacco: Never Used  . Tobacco comment: quit 25 yrs ago  Vaping Use  . Vaping Use: Never used  Substance and Sexual Activity  . Alcohol use: Not Currently  . Drug use: Never   Social Determinants of Radio broadcast assistant Strain: Not on file  Food Insecurity: Not on file  Transportation Needs: Not on file  Physical Activity: Not on file  Stress: Not on file  Social Connections: Not on file   Tobacco Counseling Counseling given: Not Answered Comment: quit 25 yrs ago  Clinical Intake: Pre-visit preparation completed: Yes Pain : No/denies pain Pain Score: 0-No pain   BMI - recorded: 29.95 Nutritional Status: BMI 25 -29 Overweight Nutritional Risks: None Diabetes: No How often do you need to have someone help you when you read instructions, pamphlets, or other written materials from your doctor or pharmacy?: 1 - Never Interpreter Needed?: No    Activities of Daily  Living In your present state of health, do you have any difficulty performing the following activities: 11/24/2020 09/15/2020  Hearing? Y Y  Comment - left hearing aids at home  Vision? N N  Difficulty concentrating or making decisions? N N  Walking or climbing stairs? N N  Dressing or bathing? N -  Doing errands, shopping? N -  Preparing Food and eating ? N -  Using the Toilet? N -  In the past six months, have you accidently leaked urine? N -  Do you have problems with loss of bowel control? N -  Managing your Medications? N -  Managing your Finances? N -  Housekeeping or managing your Housekeeping? N -  Some recent data might be hidden    Patient Care Team: Marge Duncans, PA-C as PCP - General (Physician Assistant) [place], [place]  Eye (Ophthalmology) Joie Bimler, MD as Consulting Physician (Urology) Everitt Amber, MD as Consulting Physician (Gynecologic Oncology)  Indicate any recent Medical Services you may have received from other than [facility] providers in the past year (date may be approximate).     Assessment:   This is a routine wellness examination for Brittany Lawson.  Hearing/Vision screen No exam data present  Dietary issues and exercise activities discussed: Current Exercise Habits: Home exercise routine (plays outside with five grandkids), Type of exercise: walking, Time (Minutes): 30, Frequency (Times/Week): 5, Weekly Exercise (Minutes/Week): 150, Intensity: Moderate, Exercise limited by: None identified  Goals Addressed   None    Depression Screen PHQ 2/9 Scores 11/24/2020  PHQ - 2 Score 0    Fall Risk Fall Risk  11/24/2020  Falls in the past year? 0  Number falls in past yr: 0  Injury with Fall? 0  Risk for fall due to : No Fall Risks  Follow up Falls evaluation completed;Falls prevention discussed    FALL RISK PREVENTION PERTAINING TO THE HOME:  Any stairs in or around the home? Yes  If so, are there any without handrails? No  Home free of loose throw  rugs in walkways, pet beds, electrical cords, etc? Yes  Adequate lighting in your home to reduce risk of falls? Yes   ASSISTIVE DEVICES UTILIZED TO PREVENT FALLS:  Life alert? No  Use of a cane, walker or w/c? No  Grab bars in the bathroom? No  Shower chair or bench in shower? No  Elevated toilet seat or a handicapped toilet? No   Gait steady and fast without use of assistive device  Cognitive Function:     6CIT Screen 11/24/2020  What Year? 0 points  What month? 0 points  What time? 0 points  Count back from 20 0 points  Months in reverse 0 points  Repeat phrase 0 points  Total Score 0    Immunizations Immunization History  Administered Date(s) Administered  . Influenza-Unspecified 04/16/2012, 04/16/2016, 05/17/2018, 05/05/2020  . Moderna Sars-Covid-2 Vaccination 09/12/2019, 10/15/2019, 06/20/2020  . Zoster 07/16/2014  . Zoster Recombinat (Shingrix) 03/01/2018    TDAP status: Due, Education has been provided regarding the importance of this vaccine. Advised may receive this vaccine at local pharmacy or Health Dept. Aware to provide a copy of the vaccination record if obtained from local pharmacy or Health Dept. Verbalized acceptance and understanding.  Flu Vaccine status: Up to date  Pneumococcal vaccine status: Due, Education has been provided regarding the importance of this vaccine. Advised may receive this vaccine at local pharmacy or Health Dept. Aware to provide a copy of the vaccination record if obtained from local pharmacy or Health Dept. Verbalized acceptance and understanding.  Covid-19 vaccine status: Completed vaccines  Qualifies for Shingles Vaccine? Yes   Zostavax completed Yes   Shingrix Completed?: Yes  Screening Tests Health Maintenance  Topic Date Due  . Hepatitis C Screening  Never done  . TETANUS/TDAP  Never done  . DEXA SCAN  Never done  . PNA vac Low Risk Adult (1 of 2 - PCV13) Never done  . MAMMOGRAM  12/02/2020  . INFLUENZA VACCINE   02/14/2021  . COLONOSCOPY (Pts 45-54yrs Insurance coverage will need to be confirmed)  11/17/2023  . COVID-19 Vaccine  Completed  . HPV VACCINES  Aged Out    Health Maintenance  Health Maintenance Due  Topic Date Due  . Hepatitis C Screening  Never done  . TETANUS/TDAP  Never done  . DEXA SCAN  Never done  . PNA vac Low Risk Adult (1 of 2 - PCV13) Never done    Colorectal cancer screening: Type of screening: Colonoscopy. Completed 11/13/19.  Mammogram status: patient already has scheduled  Bone Density status: Ordered at [facility] [place] Screening: (Low Dose CT Chest recommended if Age 31-80 years, 30 pack-year currently smoking OR have quit w/in 15years.) does not qualify.   Additional Screening:  Vision Screening: Recommended annual ophthalmology exams for early detection of glaucoma and other disorders of the eye. Is the patient up to date with their annual eye exam?  Yes  Who is the provider or what is the name of the office in which the patient attends annual eye exams? [facility] Screening: Recommended annual dental exams for proper oral hygiene   Plan:     1- DEXA - ordered at [facility], patient prefers a Monday after 12/23/20 2- PNA Vaccine - patient believes she received this from the pharmacy.  She is going to ask them to verify.  Information was given so if she is not up to date she can get it from the pharmacy. 3- Advance Directive - Patient does not have, printed information given  I have personally reviewed and noted the following in the patient's chart:   . Medical and social history . Use of alcohol, tobacco or illicit drugs  . Current medications and supplements including opioid prescriptions.  . Functional ability and status . Nutritional status . Physical activity . Advanced directives . List of other physicians . Hospitalizations, surgeries, and ER visits in previous 12 months . Vitals . Screenings to include  cognitive, depression, and falls . Referrals and appointments  In addition, I have reviewed and discussed with patient certain preventive protocols, quality metrics, and best practice recommendations. A written personalized care plan for preventive services as well as general preventive health recommendations were provided to patient.     Erie Noe, LPN   02/23/1750

## 2020-12-24 ENCOUNTER — Other Ambulatory Visit: Payer: Self-pay

## 2020-12-24 ENCOUNTER — Ambulatory Visit
Admission: RE | Admit: 2020-12-24 | Discharge: 2020-12-24 | Disposition: A | Discharge status: Home / Self Care | Payer: Medicare Other | Source: Ambulatory Visit | Attending: Obstetrics & Gynecology | Admitting: Obstetrics & Gynecology

## 2020-12-24 DIAGNOSIS — Z1231 Encounter for screening mammogram for malignant neoplasm of breast: Secondary | ICD-10-CM

## 2021-01-31 ENCOUNTER — Other Ambulatory Visit: Payer: Self-pay | Admitting: Physician Assistant

## 2021-02-02 ENCOUNTER — Encounter: Payer: Self-pay | Admitting: Physician Assistant

## 2021-02-02 ENCOUNTER — Other Ambulatory Visit: Payer: Self-pay

## 2021-02-02 ENCOUNTER — Ambulatory Visit (INDEPENDENT_AMBULATORY_CARE_PROVIDER_SITE_OTHER): Payer: Medicare Other | Admitting: Physician Assistant

## 2021-02-02 VITALS — BP 128/68 | HR 68 | Temp 96.8°F | Ht 65.0 in | Wt 176.0 lb

## 2021-02-02 DIAGNOSIS — F419 Anxiety disorder, unspecified: Secondary | ICD-10-CM | POA: Diagnosis not present

## 2021-02-02 DIAGNOSIS — I10 Essential (primary) hypertension: Secondary | ICD-10-CM

## 2021-02-02 DIAGNOSIS — E559 Vitamin D deficiency, unspecified: Secondary | ICD-10-CM | POA: Diagnosis not present

## 2021-02-02 DIAGNOSIS — N958 Other specified menopausal and perimenopausal disorders: Secondary | ICD-10-CM

## 2021-02-02 DIAGNOSIS — F0630 Mood disorder due to known physiological condition, unspecified: Secondary | ICD-10-CM

## 2021-02-02 DIAGNOSIS — K219 Gastro-esophageal reflux disease without esophagitis: Secondary | ICD-10-CM | POA: Diagnosis not present

## 2021-02-02 MED ORDER — VENLAFAXINE HCL ER 75 MG PO CP24: 75.0000 mg | ORAL_CAPSULE | Freq: Two times a day (BID) | ORAL | 1 refills | Status: DC

## 2021-02-02 MED ORDER — AMLODIPINE BESYLATE 5 MG PO TABS: 5.0000 mg | ORAL_TABLET | Freq: Every day | ORAL | 1 refills | Status: DC

## 2021-02-02 MED ORDER — PANTOPRAZOLE SODIUM 40 MG PO TBEC: 40.0000 mg | DELAYED_RELEASE_TABLET | Freq: Two times a day (BID) | ORAL | 1 refills | Status: DC

## 2021-02-02 MED ORDER — LOSARTAN POTASSIUM 100 MG PO TABS: 100.0000 mg | ORAL_TABLET | Freq: Every day | ORAL | 1 refills | Status: DC

## 2021-02-02 MED ORDER — MONTELUKAST SODIUM 10 MG PO TABS: 10.0000 mg | ORAL_TABLET | Freq: Every day | ORAL | 1 refills | Status: DC

## 2021-02-02 MED ORDER — TRAZODONE HCL 50 MG PO TABS: 50.0000 mg | ORAL_TABLET | Freq: Every day | ORAL | 1 refills | Status: DC

## 2021-02-02 NOTE — Progress Notes (Signed)
Acute Office Visit  Subjective:    Patient ID: Brittany Lawson, female    DOB: [birth year], 64 y.o.   MRN: [id number]  Chief Complaint  Patient presents with   Hypertension anxiety    Hypertension  Patient is in today for hypertension  Pt presents for follow up of hypertension. . The patient is tolerating the medication well without side effects. Compliance with treatment has been good; including taking medication as directed , maintains a healthy diet and regular exercise regimen , and following up as directed.pt is currently on norvasc 5mg  and cozaar 100mg - is due for labwork  Pt has history of anxiety - is currently on desyrel 50mg  and effexor 75mg  which she says works  well for her - she also uses xanax as needed  Pt with history of GERD - currently on protonix 40mg    Pt is due for dexa scan - would like to schedule Past Medical History:  Diagnosis Date   Asthma    Blood clot in vein yrs ago   x 2 right leg   Colon polyps 2019   Constipation    Depression    GERD (gastroesophageal reflux disease)    History of kidney stones 27-Apr-2020   "passed on own"   Hypertension    Left ovarian cyst    Varicose veins of both lower extremities     Past Surgical History:  Procedure Laterality Date   AUGMENTATION MAMMAPLASTY Bilateral age 51's age 70's     each redone 2018 right side calcification removed   BREAST EXCISIONAL BIOPSY Right    APPROX 20 YEARS AGO   CHOLECYSTECTOMY  age 36   lapa   colonscopy  10-2019 and 05-2020   polyp removed both times due may 2022   ROBOTIC ASSISTED TOTAL HYSTERECTOMY WITH BILATERAL SALPINGO OOPHERECTOMY N/A 09/15/2020   Procedure: XI ROBOTIC [place];  Surgeon: Everitt Amber, MD;  Location: [facility];  Service: Gynecology;  Laterality: N/A;   TUBAL LIGATION  age 4   UPPER GI ENDOSCOPY  05/2020    Family History  Problem Relation Age of Onset   Breast cancer Mother 50     Social History   Socioeconomic History   Marital status: Married    Spouse name: Not on file   Number of children: 4   Years of education: Not on file   Highest education level: Not on file  Occupational History   Occupation: homemaker  Tobacco Use   Smoking status: Former    Packs/day: 1.50    Years: 20.00    Pack years: 30.00    Types: Cigarettes   Smokeless tobacco: Never   Tobacco comments:    quit 25 yrs ago  Vaping Use   Vaping Use: Never used  Substance and Sexual Activity   Alcohol use: Not Currently   Drug use: Never   Sexual activity: Not Currently  Other Topics Concern   Not on file  Social History Narrative   Not on file   Social Determinants of Health   Financial Resource Strain: Not on file  Food Insecurity: Not on file  Transportation Needs: Not on file  Physical Activity: Not on file  Stress: Not on file  Social Connections: Not on file  Intimate Partner Violence: Not on file     Current Outpatient Medications:    albuterol (VENTOLIN HFA) 108 (90 Base) MCG/ACT inhaler, 1-2 puffs every 4 (four) hours as needed., Disp: , Rfl:  ALPRAZolam (XANAX) 0.5 MG tablet, 1 po qd prn anxiety, Disp: 30 tablet, Rfl: 1   Fluticasone-Salmeterol (ADVAIR DISKUS) 250-50 MCG/DOSE AEPB, Inhale 1 puff into the lungs in the morning and at bedtime. (Patient taking differently: Inhale 1 puff into the lungs 2 (two) times daily as needed.), Disp: 1 each, Rfl: 5   ibuprofen (ADVIL) 800 MG tablet, Take 1 tablet (800 mg total) by mouth every 8 (eight) hours as needed for moderate pain. For AFTER surgery only, Disp: 30 tablet, Rfl: 0   meclizine (ANTIVERT) 25 MG tablet, Take 25 mg by mouth 3 (three) times daily as needed for dizziness., Disp: , Rfl:    senna-docusate (SENOKOT-S) 8.6-50 MG tablet, Take 2 tablets by mouth at bedtime. For AFTER surgery, do not take if having diarrhea, Disp: 30 tablet, Rfl: 0   traMADol (ULTRAM) 50 MG tablet, Take 1 tablet (50 mg total) by mouth  every 6 (six) hours as needed for severe pain. For AFTER surgery only, do not take and drive, Disp: 10 tablet, Rfl: 0   amLODipine (NORVASC) 5 MG tablet, Take 1 tablet (5 mg total) by mouth daily., Disp: 90 tablet, Rfl: 1   losartan (COZAAR) 100 MG tablet, Take 1 tablet (100 mg total) by mouth at bedtime., Disp: 90 tablet, Rfl: 1   montelukast (SINGULAIR) 10 MG tablet, Take 1 tablet (10 mg total) by mouth daily., Disp: 90 tablet, Rfl: 1   pantoprazole (PROTONIX) 40 MG tablet, Take 1 tablet (40 mg total) by mouth 2 (two) times daily., Disp: 180 tablet, Rfl: 1   traZODone (DESYREL) 50 MG tablet, Take 1 tablet (50 mg total) by mouth at bedtime., Disp: 90 tablet, Rfl: 1   venlafaxine XR (EFFEXOR-XR) 75 MG 24 hr capsule, Take 1 capsule (75 mg total) by mouth 2 (two) times daily., Disp: 180 capsule, Rfl: 1   Allergies  Allergen Reactions   Sulfa Antibiotics Nausea Only and Other (See Comments)    Childhood reaction   CONSTITUTIONAL: Negative for chills, fatigue, fever, unintentional weight gain and unintentional weight loss.  CARDIOVASCULAR: Negative for chest pain, dizziness, palpitations and pedal edema.  RESPIRATORY: Negative for recent cough and dyspnea.  GASTROINTESTINAL: Negative for abdominal pain, acid reflux symptoms, constipation, diarrhea, nausea and vomiting.  INTEGUMENTARY: Negative for rash.  PSYCHIATRIC: Negative for sleep disturbance and to question depression screen.  Negative for depression, negative for anhedonia.         Objective:  PHYSICAL EXAM:   VS: BP 128/68 (BP Location: Left Arm, Patient Position: Sitting, Cuff Size: Normal)   Pulse 68   Temp (!) 96.8 F (36 C) (Temporal)   Ht 5\' 5"  (1.651 m)   Wt 176 lb (79.8 kg)   SpO2 97%   BMI 29.29 kg/m   GEN: Well nourished, well developed, in no acute distress  Cardiac: RRR; no murmurs, rubs, or gallops,no edema -  Respiratory:  normal respiratory rate and pattern with no distress - normal breath sounds with no rales,  rhonchi, wheezes or rubs MS: no deformity or atrophy  Skin: warm and dry, no rash  Psych: euthymic mood, appropriate affect and demeanor   Wt Readings from Last 3 Encounters:  02/02/21 176 lb (79.8 kg)  11/24/20 180 lb (81.6 kg)  10/15/20 183 lb 3.2 oz (83.1 kg)    Health Maintenance Due  Topic Date Due   DEXA SCAN  Never done   COVID-19 Vaccine (4 - Booster for Moderna series) 10/19/2020    There are no preventive care reminders  to display for this patient.        Assessment & Plan:   Problem List Items Addressed This Visit       Cardiovascular and Mediastinum   Essential hypertension - Primary   Relevant Medications   amLODipine (NORVASC) 5 MG tablet   losartan (COZAAR) 100 MG tablet   Other Relevant Orders   CBC with Differential/Platelet   Comprehensive metabolic panel   TSH   Lipid panel     Digestive   GERD (gastroesophageal reflux disease)   Relevant Medications   pantoprazole (PROTONIX) 40 MG tablet     Other   Anxiety   Relevant Medications   traZODone (DESYREL) 50 MG tablet   venlafaxine XR (EFFEXOR-XR) 75 MG 24 hr capsule   Other Visit Diagnoses     Vitamin D insufficiency       Relevant Orders   VITAMIN D 25 Hydroxy (Vit-D Deficiency, Fractures)   Postmenopausal related mood disorder       Other specified menopausal and perimenopausal disorders       Relevant Orders   DG Bone Density        Meds ordered this encounter  Medications   amLODipine (NORVASC) 5 MG tablet    Sig: Take 1 tablet (5 mg total) by mouth daily.    Dispense:  90 tablet    Refill:  1    Order Specific Question:   Supervising Provider    Answer:   Shelton Silvas   losartan (COZAAR) 100 MG tablet    Sig: Take 1 tablet (100 mg total) by mouth at bedtime.    Dispense:  90 tablet    Refill:  1    Order Specific Question:   Supervising Provider    Answer:   COX, KIRSTEN [[id number]]   montelukast (SINGULAIR) 10 MG tablet    Sig: Take 1 tablet (10 mg total) by  mouth daily.    Dispense:  90 tablet    Refill:  1    Order Specific Question:   Supervising Provider    Answer:   Shelton Silvas   pantoprazole (PROTONIX) 40 MG tablet    Sig: Take 1 tablet (40 mg total) by mouth 2 (two) times daily.    Dispense:  180 tablet    Refill:  1    Order Specific Question:   Supervising Provider    Answer:   Shelton Silvas   traZODone (DESYREL) 50 MG tablet    Sig: Take 1 tablet (50 mg total) by mouth at bedtime.    Dispense:  90 tablet    Refill:  1    Order Specific Question:   Supervising Provider    Answer:   Shelton Silvas   venlafaxine XR (EFFEXOR-XR) 75 MG 24 hr capsule    Sig: Take 1 capsule (75 mg total) by mouth 2 (two) times daily.    Dispense:  180 capsule    Refill:  1    Order Specific Question:   Supervising Provider    Answer:   COX, KIRSTEN [[id number]]     [place], PA-C

## 2021-02-03 ENCOUNTER — Other Ambulatory Visit: Payer: Self-pay | Admitting: Physician Assistant

## 2021-02-03 LAB — CBC WITH DIFFERENTIAL/PLATELET
Basophils Absolute: 0.1 10*3/uL (ref 0.0–0.2)
Basos: 1 %
EOS (ABSOLUTE): 0.3 10*3/uL (ref 0.0–0.4)
Eos: 6 %
Hematocrit: 41.9 % (ref 34.0–46.6)
Hemoglobin: 13.2 g/dL (ref 11.1–15.9)
Immature Grans (Abs): 0 10*3/uL (ref 0.0–0.1)
Immature Granulocytes: 0 %
Lymphocytes Absolute: 2 10*3/uL (ref 0.7–3.1)
Lymphs: 34 %
MCH: 25.6 pg — ABNORMAL LOW (ref 26.6–33.0)
MCHC: 31.5 g/dL (ref 31.5–35.7)
MCV: 81 fL (ref 79–97)
Monocytes Absolute: 0.6 10*3/uL (ref 0.1–0.9)
Monocytes: 10 %
Neutrophils Absolute: 2.8 10*3/uL (ref 1.4–7.0)
Neutrophils: 49 %
Platelets: 239 10*3/uL (ref 150–450)
RBC: 5.15 x10E6/uL (ref 3.77–5.28)
RDW: 14 % (ref 11.7–15.4)
WBC: 5.8 10*3/uL (ref 3.4–10.8)

## 2021-02-03 LAB — LIPID PANEL
Chol/HDL Ratio: 3.6 ratio (ref 0.0–4.4)
Cholesterol, Total: 218 mg/dL — ABNORMAL HIGH (ref 100–199)
HDL: 61 mg/dL (ref >39)
LDL Chol Calc (NIH): 140 mg/dL — ABNORMAL HIGH (ref 0–99)
Triglycerides: 96 mg/dL (ref 0–149)
VLDL Cholesterol Cal: 17 mg/dL (ref 5–40)

## 2021-02-03 LAB — COMPREHENSIVE METABOLIC PANEL
ALT: 14 IU/L (ref 0–32)
AST: 18 IU/L (ref 0–40)
Albumin/Globulin Ratio: 1.3 (ref 1.2–2.2)
Albumin: 4.3 g/dL (ref 3.8–4.8)
Alkaline Phosphatase: 75 IU/L (ref 44–121)
BUN/Creatinine Ratio: 16 (ref 12–28)
BUN: 14 mg/dL (ref 8–27)
Bilirubin Total: 0.2 mg/dL (ref 0.0–1.2)
CO2: 22 mmol/L (ref 20–29)
Calcium: 9.5 mg/dL (ref 8.7–10.3)
Chloride: 104 mmol/L (ref 96–106)
Creatinine, Ser: 0.86 mg/dL (ref 0.57–1.00)
Globulin, Total: 3.2 g/dL (ref 1.5–4.5)
Glucose: 98 mg/dL (ref 65–99)
Potassium: 4.5 mmol/L (ref 3.5–5.2)
Sodium: 140 mmol/L (ref 134–144)
Total Protein: 7.5 g/dL (ref 6.0–8.5)
eGFR: 74 mL/min/{1.73_m2} (ref >59)

## 2021-02-03 LAB — TSH: TSH: 2.45 u[IU]/mL (ref 0.450–4.500)

## 2021-02-03 LAB — VITAMIN D 25 HYDROXY (VIT D DEFICIENCY, FRACTURES): Vit D, 25-Hydroxy: 57.8 ng/mL (ref 30.0–100.0)

## 2021-02-03 LAB — CARDIOVASCULAR RISK ASSESSMENT

## 2021-02-03 MED ORDER — PRAVASTATIN SODIUM 20 MG PO TABS: 20.0000 mg | ORAL_TABLET | Freq: Every day | ORAL | 0 refills | Status: DC

## 2021-02-09 ENCOUNTER — Telehealth: Payer: Self-pay | Admitting: Physician Assistant

## 2021-02-09 NOTE — Telephone Encounter (Signed)
   Brittany Lawson has been scheduled for the following appointment:  WHAT: BONE DENSITY WHERE: [facility] DATE: 03/16/21 TIME: 8:00 AM ARRIVAL TIME  A message has been left for the patient.

## 2021-04-01 ENCOUNTER — Other Ambulatory Visit: Payer: Self-pay

## 2021-04-01 ENCOUNTER — Ambulatory Visit (INDEPENDENT_AMBULATORY_CARE_PROVIDER_SITE_OTHER): Payer: Medicare Other | Admitting: Physician Assistant

## 2021-04-01 ENCOUNTER — Encounter: Payer: Self-pay | Admitting: Physician Assistant

## 2021-04-01 VITALS — BP 128/68 | HR 64 | Temp 97.5°F | Ht 64.0 in | Wt 179.4 lb

## 2021-04-01 DIAGNOSIS — I889 Nonspecific lymphadenitis, unspecified: Secondary | ICD-10-CM | POA: Diagnosis not present

## 2021-04-01 LAB — CBC WITH DIFFERENTIAL/PLATELET
Basophils Absolute: 0.1 10*3/uL (ref 0.0–0.2)
Basos: 1 %
EOS (ABSOLUTE): 0.5 10*3/uL — ABNORMAL HIGH (ref 0.0–0.4)
Eos: 8 %
Hematocrit: 38.6 % (ref 34.0–46.6)
Hemoglobin: 12.6 g/dL (ref 11.1–15.9)
Immature Grans (Abs): 0 10*3/uL (ref 0.0–0.1)
Immature Granulocytes: 1 %
Lymphocytes Absolute: 1.7 10*3/uL (ref 0.7–3.1)
Lymphs: 30 %
MCH: 26.5 pg — ABNORMAL LOW (ref 26.6–33.0)
MCHC: 32.6 g/dL (ref 31.5–35.7)
MCV: 81 fL (ref 79–97)
Monocytes Absolute: 0.5 10*3/uL (ref 0.1–0.9)
Monocytes: 9 %
Neutrophils Absolute: 3 10*3/uL (ref 1.4–7.0)
Neutrophils: 51 %
Platelets: 250 10*3/uL (ref 150–450)
RBC: 4.76 x10E6/uL (ref 3.77–5.28)
RDW: 13.7 % (ref 11.7–15.4)
WBC: 5.9 10*3/uL (ref 3.4–10.8)

## 2021-04-01 MED ORDER — CEPHALEXIN 500 MG PO CAPS: 500.0000 mg | ORAL_CAPSULE | Freq: Two times a day (BID) | ORAL | 0 refills | Status: DC

## 2021-04-01 NOTE — Progress Notes (Signed)
Acute Office Visit  Subjective:    Patient ID: Brittany Lawson, female    DOB: Jan 10, 1956, 65 y.o.   MRN: [id number]  Chief Complaint  Patient presents with   Knot under chin    HPI Patient is in today for complaints of knot under her chin -- she states on Sunday she noted a pimple type lesion under her tongue - was swollen and inflamed - that had improved and then on Monday noted swelling under her chin She had antibiotics at home and took 3 doses of amoxil - states that area has slightly improved Describes and moevable, tender to touch Denies fever  Past Medical History:  Diagnosis Date   Asthma    Blood clot in vein yrs ago   x 2 right leg   Colon polyps 2019   Constipation    Depression    GERD (gastroesophageal reflux disease)    History of kidney stones Apr 04, 2020   "passed on own"   Hypertension    Left ovarian cyst    Varicose veins of both lower extremities     Past Surgical History:  Procedure Laterality Date   AUGMENTATION MAMMAPLASTY Bilateral age 23's age 39's     each redone 2018 right side calcification removed   BREAST EXCISIONAL BIOPSY Right    APPROX 20 YEARS AGO   CHOLECYSTECTOMY  age 19   [facility]   colonscopy  10-2019 and 05-2020   polyp removed both times due may 2022   ROBOTIC ASSISTED TOTAL HYSTERECTOMY WITH BILATERAL SALPINGO OOPHERECTOMY N/A 09/15/2020   Procedure: XI ROBOTIC [place];  Surgeon: Everitt Amber, MD;  Location: [facility];  Service: Gynecology;  Laterality: N/A;   TUBAL LIGATION  age 61   UPPER GI ENDOSCOPY  05/2020    Family History  Problem Relation Age of Onset   Breast cancer Mother 51    Social History   Socioeconomic History   Marital status: Married    Spouse name: Not on file   Number of children: 4   Years of education: Not on file   Highest education level: Not on file  Occupational History   Occupation: homemaker  Tobacco Use   Smoking  status: Former    Packs/day: 1.50    Years: 20.00    Pack years: 30.00    Types: Cigarettes   Smokeless tobacco: Never   Tobacco comments:    quit 25 yrs ago  Vaping Use   Vaping Use: Never used  Substance and Sexual Activity   Alcohol use: Not Currently   Drug use: Never   Sexual activity: Not Currently  Other Topics Concern   Not on file  Social History Narrative   Not on file   Social Determinants of Health   Financial Resource Strain: Not on file  Food Insecurity: Not on file  Transportation Needs: Not on file  Physical Activity: Not on file  Stress: Not on file  Social Connections: Not on file  Intimate Partner Violence: Not on file    Outpatient Medications Prior to Visit  Medication Sig Dispense Refill   albuterol (VENTOLIN HFA) 108 (90 Base) MCG/ACT inhaler 1-2 puffs every 4 (four) hours as needed.     ALPRAZolam (XANAX) 0.5 MG tablet 1 po qd prn anxiety 30 tablet 1   amLODipine (NORVASC) 5 MG tablet Take 1 tablet (5 mg total) by mouth daily. 90 tablet 1   Fluticasone-Salmeterol (ADVAIR DISKUS) 250-50 MCG/DOSE AEPB Inhale 1  puff into the lungs in the morning and at bedtime. (Patient taking differently: Inhale 1 puff into the lungs 2 (two) times daily as needed.) 1 each 5   ibuprofen (ADVIL) 800 MG tablet Take 1 tablet (800 mg total) by mouth every 8 (eight) hours as needed for moderate pain. For AFTER surgery only 30 tablet 0   losartan (COZAAR) 100 MG tablet Take 1 tablet (100 mg total) by mouth at bedtime. 90 tablet 1   meclizine (ANTIVERT) 25 MG tablet Take 25 mg by mouth 3 (three) times daily as needed for dizziness.     montelukast (SINGULAIR) 10 MG tablet Take 1 tablet (10 mg total) by mouth daily. 90 tablet 1   pantoprazole (PROTONIX) 40 MG tablet Take 1 tablet (40 mg total) by mouth 2 (two) times daily. 180 tablet 1   pravastatin (PRAVACHOL) 20 MG tablet Take 1 tablet (20 mg total) by mouth daily. 90 tablet 0   senna-docusate (SENOKOT-S) 8.6-50 MG tablet Take  2 tablets by mouth at bedtime. For AFTER surgery, do not take if having diarrhea 30 tablet 0   traMADol (ULTRAM) 50 MG tablet Take 1 tablet (50 mg total) by mouth every 6 (six) hours as needed for severe pain. For AFTER surgery only, do not take and drive 10 tablet 0   traZODone (DESYREL) 50 MG tablet Take 1 tablet (50 mg total) by mouth at bedtime. 90 tablet 1   venlafaxine XR (EFFEXOR-XR) 75 MG 24 hr capsule Take 1 capsule (75 mg total) by mouth 2 (two) times daily. 180 capsule 1   No facility-administered medications prior to visit.    Allergies  Allergen Reactions   Sulfa Antibiotics Nausea Only and Other (See Comments)    Childhood reaction    Review of Systems CONSTITUTIONAL: Negative for chills, fatigue, fever, unintentional weight gain and unintentional weight loss.  E/N/T: see HPI CARDIOVASCULAR: Negative for chest pain, dizziness, palpitations and pedal edema.  RESPIRATORY: Negative for recent cough and dyspnea.          Objective:    Physical Exam  BP 128/68 (BP Location: Left Arm, Patient Position: Sitting, Cuff Size: Normal)   Pulse 64   Temp (!) 97.5 F (36.4 C) (Temporal)   Ht '5\' 4"'  (1.626 m)   Wt 179 lb 6.4 oz (81.4 kg)   SpO2 98%   BMI 30.79 kg/m  Wt Readings from Last 3 Encounters:  04/01/21 179 lb 6.4 oz (81.4 kg)  02/02/21 176 lb (79.8 kg)  11/24/20 180 lb (81.6 kg)   PHYSICAL EXAM:   VS: BP 128/68 (BP Location: Left Arm, Patient Position: Sitting, Cuff Size: Normal)   Pulse 64   Temp (!) 97.5 F (36.4 C) (Temporal)   Ht '5\' 4"'  (1.626 m)   Wt 179 lb 6.4 oz (81.4 kg)   SpO2 98%   BMI 30.79 kg/m   GEN: Well nourished, well developed, in no acute distress  Oropharynx - normal mucosa, palate, and posterior pharynx - no lesions noted under tongue Neck: palpable submental lymphadenopathy noted Cardiac: RRR; no murmurs, rubs, or gallops, Respiratory:  normal respiratory rate and pattern with no distress - normal breath sounds with no rales,  rhonchi, wheezes or rubs  Health Maintenance Due  Topic Date Due   DEXA SCAN  Never done   COVID-19 Vaccine (4 - Booster for Moderna series) 10/19/2020   INFLUENZA VACCINE  02/14/2021    There are no preventive care reminders to display for this patient.   Lab Results  Component Value Date   TSH 2.450 02/02/2021   Lab Results  Component Value Date   WBC 5.8 02/02/2021   HGB 13.2 02/02/2021   HCT 41.9 02/02/2021   MCV 81 02/02/2021   PLT 239 02/02/2021   Lab Results  Component Value Date   NA 140 02/02/2021   K 4.5 02/02/2021   CO2 22 02/02/2021   GLUCOSE 98 02/02/2021   BUN 14 02/02/2021   CREATININE 0.86 02/02/2021   BILITOT 0.2 02/02/2021   ALKPHOS 75 02/02/2021   AST 18 02/02/2021   ALT 14 02/02/2021   PROT 7.5 02/02/2021   ALBUMIN 4.3 02/02/2021   CALCIUM 9.5 02/02/2021   ANIONGAP 12 09/13/2020   EGFR 74 02/02/2021   Lab Results  Component Value Date   CHOL 218 (H) 02/02/2021   Lab Results  Component Value Date   HDL 61 02/02/2021   Lab Results  Component Value Date   LDLCALC 140 (H) 02/02/2021   Lab Results  Component Value Date   TRIG 96 02/02/2021   Lab Results  Component Value Date   CHOLHDL 3.6 02/02/2021   No results found for: HGBA1C     Assessment & Plan:  1. Lymphadenitis - CBC with Differential/Platelet - cephALEXin (KEFLEX) 500 MG capsule; Take 1 capsule (500 mg total) by mouth 2 (two) times daily.  Dispense: 30 capsule; Refill: 0  Recommend salt water gargles  Meds ordered this encounter  Medications   cephALEXin (KEFLEX) 500 MG capsule    Sig: Take 1 capsule (500 mg total) by mouth 2 (two) times daily.    Dispense:  30 capsule    Refill:  0    Order Specific Question:   Supervising Provider    AnswerShelton Silvas    Orders Placed This Encounter  Procedures   CBC with Differential/Platelet      Follow-up: Return in about 2 weeks (around 04/15/2021).  An After Visit Summary was printed and given to the  patient.  Yetta Flock [facility] [phone]

## 2021-04-15 ENCOUNTER — Encounter: Payer: Self-pay | Admitting: Physician Assistant

## 2021-04-15 ENCOUNTER — Ambulatory Visit (INDEPENDENT_AMBULATORY_CARE_PROVIDER_SITE_OTHER): Payer: Medicare Other | Admitting: Physician Assistant

## 2021-04-15 ENCOUNTER — Other Ambulatory Visit: Payer: Self-pay

## 2021-04-15 ENCOUNTER — Ambulatory Visit (INDEPENDENT_AMBULATORY_CARE_PROVIDER_SITE_OTHER): Payer: Medicare Other

## 2021-04-15 VITALS — BP 118/70 | HR 75 | Temp 97.5°F | Resp 18 | Ht 65.0 in | Wt 182.0 lb

## 2021-04-15 DIAGNOSIS — Z23 Encounter for immunization: Secondary | ICD-10-CM | POA: Diagnosis not present

## 2021-04-15 DIAGNOSIS — R221 Localized swelling, mass and lump, neck: Secondary | ICD-10-CM

## 2021-04-15 NOTE — Progress Notes (Signed)
Acute Office Visit  Subjective:    Patient ID: Brittany Lawson, female    DOB: 09-30-1955, 65 y.o.   MRN: [id number]  Chief Complaint  Patient presents with   lymphadenitis fu    HPI Patient is in today for follow up of neck mass - she has taken the antibiotics and area of neck/under jaw has seemed to possibly get smaller however still has well defined mass lower jaw line/neck States is not tender Denies other symptoms - no fever Says she does have a small bump that comes up intermittently under her tongue but feels she has always had that  Past Medical History:  Diagnosis Date   Asthma    Blood clot in vein yrs ago   x 2 right leg   Colon polyps 2019   Constipation    Depression    GERD (gastroesophageal reflux disease)    History of kidney stones 2020/04/06   "passed on own"   Hypertension    Left ovarian cyst    Varicose veins of both lower extremities     Past Surgical History:  Procedure Laterality Date   AUGMENTATION MAMMAPLASTY Bilateral age 87's age 20's     each redone 2018 right side calcification removed   BREAST EXCISIONAL BIOPSY Right    APPROX 20 YEARS AGO   CHOLECYSTECTOMY  age 36   [facility]   colonscopy  10-2019 and 05-2020   polyp removed both times due may 2022   ROBOTIC ASSISTED TOTAL HYSTERECTOMY WITH BILATERAL SALPINGO OOPHERECTOMY N/A 09/15/2020   Procedure: XI ROBOTIC [place];  Surgeon: Everitt Amber, MD;  Location: [place];  Service: Gynecology;  Laterality: N/A;   TUBAL LIGATION  age 47   UPPER GI ENDOSCOPY  05/2020    Family History  Problem Relation Age of Onset   Breast cancer Mother 46    Social History   Socioeconomic History   Marital status: Married    Spouse name: Not on file   Number of children: 4   Years of education: Not on file   Highest education level: Not on file  Occupational History   Occupation: homemaker  Tobacco Use   Smoking status: Former     Packs/day: 1.50    Years: 20.00    Pack years: 30.00    Types: Cigarettes   Smokeless tobacco: Never   Tobacco comments:    quit 25 yrs ago  Vaping Use   Vaping Use: Never used  Substance and Sexual Activity   Alcohol use: Not Currently   Drug use: Never   Sexual activity: Not Currently  Other Topics Concern   Not on file  Social History Narrative   Not on file   Social Determinants of Health   Financial Resource Strain: Not on file  Food Insecurity: Not on file  Transportation Needs: Not on file  Physical Activity: Not on file  Stress: Not on file  Social Connections: Not on file  Intimate Partner Violence: Not on file    Outpatient Medications Prior to Visit  Medication Sig Dispense Refill   albuterol (VENTOLIN HFA) 108 (90 Base) MCG/ACT inhaler 1-2 puffs every 4 (four) hours as needed.     ALPRAZolam (XANAX) 0.5 MG tablet 1 po qd prn anxiety 30 tablet 1   amLODipine (NORVASC) 5 MG tablet Take 1 tablet (5 mg total) by mouth daily. 90 tablet 1   cephALEXin (KEFLEX) 500 MG capsule Take 1 capsule (500 mg total)  by mouth 2 (two) times daily. 30 capsule 0   Fluticasone-Salmeterol (ADVAIR DISKUS) 250-50 MCG/DOSE AEPB Inhale 1 puff into the lungs in the morning and at bedtime. (Patient taking differently: Inhale 1 puff into the lungs 2 (two) times daily as needed.) 1 each 5   ibuprofen (ADVIL) 800 MG tablet Take 1 tablet (800 mg total) by mouth every 8 (eight) hours as needed for moderate pain. For AFTER surgery only 30 tablet 0   losartan (COZAAR) 100 MG tablet Take 1 tablet (100 mg total) by mouth at bedtime. 90 tablet 1   meclizine (ANTIVERT) 25 MG tablet Take 25 mg by mouth 3 (three) times daily as needed for dizziness.     montelukast (SINGULAIR) 10 MG tablet Take 1 tablet (10 mg total) by mouth daily. 90 tablet 1   pantoprazole (PROTONIX) 40 MG tablet Take 1 tablet (40 mg total) by mouth 2 (two) times daily. 180 tablet 1   pravastatin (PRAVACHOL) 20 MG tablet Take 1  tablet (20 mg total) by mouth daily. 90 tablet 0   traZODone (DESYREL) 50 MG tablet Take 1 tablet (50 mg total) by mouth at bedtime. 90 tablet 1   venlafaxine XR (EFFEXOR-XR) 75 MG 24 hr capsule Take 1 capsule (75 mg total) by mouth 2 (two) times daily. 180 capsule 1   senna-docusate (SENOKOT-S) 8.6-50 MG tablet Take 2 tablets by mouth at bedtime. For AFTER surgery, do not take if having diarrhea 30 tablet 0   traMADol (ULTRAM) 50 MG tablet Take 1 tablet (50 mg total) by mouth every 6 (six) hours as needed for severe pain. For AFTER surgery only, do not take and drive 10 tablet 0   No facility-administered medications prior to visit.    Allergies  Allergen Reactions   Sulfa Antibiotics Nausea Only and Other (See Comments)    Childhood reaction    Review of Systems CONSTITUTIONAL: Negative for chills, fatigue, fever, unintentional weight gain and unintentional weight loss.  E/N/T - see HPI CARDIOVASCULAR: Negative for chest pain, dizziness, palpitations and pedal edema.  RESPIRATORY: Negative for recent cough and dyspnea.  GASTROINTESTINAL: Negative for abdominal pain, acid reflux symptoms, constipation, diarrhea, nausea and vomiting.          Objective:    Physical Exam PHYSICAL EXAM:   VS: BP 118/70   Pulse 75   Temp (!) 97.5 F (36.4 C)   Resp 18   Ht '5\' 5"'  (1.651 m)   Wt 182 lb (82.6 kg)   SpO2 98%   BMI 30.29 kg/m   GEN: Well nourished, well developed, in no acute distress  Oropharynx - normal mucosa, palate, and posterior pharynx Neck: fullness/mass noted right jaw line Cardiac: RRR; no murmurs, rubs, or gallops,no edema -  Respiratory:  normal respiratory rate and pattern with no distress - normal breath sounds with no rales, rhonchi, wheezes or rubs   BP 118/70   Pulse 75   Temp (!) 97.5 F (36.4 C)   Resp 18   Ht '5\' 5"'  (1.651 m)   Wt 182 lb (82.6 kg)   SpO2 98%   BMI 30.29 kg/m  Wt Readings from Last 3 Encounters:  04/15/21 182 lb (82.6 kg)   04/01/21 179 lb 6.4 oz (81.4 kg)  02/02/21 176 lb (79.8 kg)    Health Maintenance Due  Topic Date Due   DEXA SCAN  Never done   COVID-19 Vaccine (4 - Booster for Moderna series) 10/19/2020   INFLUENZA VACCINE  02/14/2021  There are no preventive care reminders to display for this patient.   Lab Results  Component Value Date   TSH 2.450 02/02/2021   Lab Results  Component Value Date   WBC 5.9 04/01/2021   HGB 12.6 04/01/2021   HCT 38.6 04/01/2021   MCV 81 04/01/2021   PLT 250 04/01/2021   Lab Results  Component Value Date   NA 140 02/02/2021   K 4.5 02/02/2021   CO2 22 02/02/2021   GLUCOSE 98 02/02/2021   BUN 14 02/02/2021   CREATININE 0.86 02/02/2021   BILITOT 0.2 02/02/2021   ALKPHOS 75 02/02/2021   AST 18 02/02/2021   ALT 14 02/02/2021   PROT 7.5 02/02/2021   ALBUMIN 4.3 02/02/2021   CALCIUM 9.5 02/02/2021   ANIONGAP 12 09/13/2020   EGFR 74 02/02/2021   Lab Results  Component Value Date   CHOL 218 (H) 02/02/2021   Lab Results  Component Value Date   HDL 61 02/02/2021   Lab Results  Component Value Date   LDLCALC 140 (H) 02/02/2021   Lab Results  Component Value Date   TRIG 96 02/02/2021   Lab Results  Component Value Date   CHOLHDL 3.6 02/02/2021   No results found for: HGBA1C     Assessment & Plan:  1. Neck mass - CT Soft Tissue Neck W Contrast    No orders of the defined types were placed in this encounter.   Orders Placed This Encounter  Procedures   CT Soft Tissue Neck W Contrast       Follow-up: Return if symptoms worsen or fail to improve.  An After Visit Summary was printed and given to the patient.  Yetta Flock [facility] [phone]

## 2021-04-18 ENCOUNTER — Telehealth: Payer: Self-pay | Admitting: Physician Assistant

## 2021-04-18 NOTE — Telephone Encounter (Signed)
   Brittany Lawson has been scheduled for the following appointment:  WHAT: NECK CT WHERE: [facility] DATE: 05/02/21 TIME: 9:30 AM ARRIVAL TIME  A message has been left for the patient. NO SOLD FOODS 2HRS PRIOR TO APPT

## 2021-04-25 ENCOUNTER — Other Ambulatory Visit: Payer: Self-pay | Admitting: Physician Assistant

## 2021-04-27 ENCOUNTER — Other Ambulatory Visit: Payer: Self-pay | Admitting: Physician Assistant

## 2021-07-22 ENCOUNTER — Other Ambulatory Visit: Payer: Self-pay | Admitting: Physician Assistant

## 2021-07-22 DIAGNOSIS — F419 Anxiety disorder, unspecified: Secondary | ICD-10-CM

## 2021-07-22 DIAGNOSIS — K219 Gastro-esophageal reflux disease without esophagitis: Secondary | ICD-10-CM

## 2021-07-22 DIAGNOSIS — I10 Essential (primary) hypertension: Secondary | ICD-10-CM

## 2021-08-08 ENCOUNTER — Ambulatory Visit (INDEPENDENT_AMBULATORY_CARE_PROVIDER_SITE_OTHER): Payer: Medicare Other | Admitting: Physician Assistant

## 2021-08-08 ENCOUNTER — Other Ambulatory Visit: Payer: Self-pay

## 2021-08-08 ENCOUNTER — Encounter: Payer: Self-pay | Admitting: Physician Assistant

## 2021-08-08 VITALS — BP 128/62 | HR 75 | Temp 98.2°F | Ht 65.0 in | Wt 180.0 lb

## 2021-08-08 DIAGNOSIS — I10 Essential (primary) hypertension: Secondary | ICD-10-CM

## 2021-08-08 DIAGNOSIS — F419 Anxiety disorder, unspecified: Secondary | ICD-10-CM | POA: Diagnosis not present

## 2021-08-08 DIAGNOSIS — K219 Gastro-esophageal reflux disease without esophagitis: Secondary | ICD-10-CM | POA: Diagnosis not present

## 2021-08-08 DIAGNOSIS — E559 Vitamin D deficiency, unspecified: Secondary | ICD-10-CM

## 2021-08-08 DIAGNOSIS — E782 Mixed hyperlipidemia: Secondary | ICD-10-CM

## 2021-08-08 NOTE — Progress Notes (Signed)
Acute Office Visit  Subjective:    Patient ID: Brittany Lawson, female    DOB: September 17, 1955, 66 y.o.   MRN: [id number]  Chief Complaint  Patient presents with   Hypertension anxiety    Hypertension  Hyperlipidemia  Patient is in today for hypertension  Pt presents for follow up of hypertension. . The patient is tolerating the medication well without side effects. Compliance with treatment has been good; including taking medication as directed , maintains a healthy diet and regular exercise regimen , and following up as directed.pt is currently on norvasc 5mg  and cozaar 100mg - is due for labwork  Pt has history of anxiety - is currently on desyrel 50mg  and effexor 75mg  which she says works  well for her - she also uses xanax as needed  Pt with history of GERD - currently on protonix 40mg    Past Medical History:  Diagnosis Date   Asthma    Blood clot in vein yrs ago   x 2 right leg   Colon polyps 2019   Constipation    Depression    GERD (gastroesophageal reflux disease)    History of kidney stones 04-15-20   "passed on own"   Hypertension    Left ovarian cyst    Varicose veins of both lower extremities     Past Surgical History:  Procedure Laterality Date   AUGMENTATION MAMMAPLASTY Bilateral age 67's age 5's     each redone 2018 right side calcification removed   BREAST EXCISIONAL BIOPSY Right    APPROX 20 YEARS AGO   CHOLECYSTECTOMY  age 34   [facility]   colonscopy  10-2019 and 05-2020   polyp removed both times due may 2022   ROBOTIC ASSISTED TOTAL HYSTERECTOMY WITH BILATERAL SALPINGO OOPHERECTOMY N/A 09/15/2020   Procedure: XI ROBOTIC [place];  Surgeon: Everitt Amber, MD;  Location: [place];  Service: Gynecology;  Laterality: N/A;   TUBAL LIGATION  age 51   UPPER GI ENDOSCOPY  05/2020    Family History  Problem Relation Age of Onset   Breast cancer Mother 48    Social History    Socioeconomic History   Marital status: Married    Spouse name: Not on file   Number of children: 4   Years of education: Not on file   Highest education level: Not on file  Occupational History   Occupation: homemaker  Tobacco Use   Smoking status: Former    Packs/day: 1.50    Years: 20.00    Pack years: 30.00    Types: Cigarettes   Smokeless tobacco: Never   Tobacco comments:    quit 25 yrs ago  Vaping Use   Vaping Use: Never used  Substance and Sexual Activity   Alcohol use: Not Currently   Drug use: Never   Sexual activity: Not Currently  Other Topics Concern   Not on file  Social History Narrative   Not on file   Social Determinants of Health   Financial Resource Strain: Not on file  Food Insecurity: Not on file  Transportation Needs: Not on file  Physical Activity: Not on file  Stress: Not on file  Social Connections: Not on file  Intimate Partner Violence: Not on file     Current Outpatient Medications:    ADVAIR DISKUS 250-50 MCG/ACT AEPB, INHALE 1 PUFF INTO THE LUNGS IN THE MORNING AND AT BEDTIME., Disp: 60 each, Rfl: 4   albuterol (VENTOLIN HFA) 108 (90  Base) MCG/ACT inhaler, 1-2 puffs every 4 (four) hours as needed., Disp: , Rfl:    ALPRAZolam (XANAX) 0.5 MG tablet, 1 po qd prn anxiety, Disp: 30 tablet, Rfl: 1   amLODipine (NORVASC) 5 MG tablet, TAKE 1 TABLET BY MOUTH DAILY, Disp: 90 tablet, Rfl: 0   ibuprofen (ADVIL) 800 MG tablet, Take 1 tablet (800 mg total) by mouth every 8 (eight) hours as needed for moderate pain. For AFTER surgery only, Disp: 30 tablet, Rfl: 0   losartan (COZAAR) 100 MG tablet, TAKE 1 TABLET BY MOUTH AT BEDTIME., Disp: 90 tablet, Rfl: 0   meclizine (ANTIVERT) 25 MG tablet, Take 25 mg by mouth 3 (three) times daily as needed for dizziness., Disp: , Rfl:    montelukast (SINGULAIR) 10 MG tablet, TAKE 1 TABLET BY MOUTH DAILY., Disp: 90 tablet, Rfl: 0   pantoprazole (PROTONIX) 40 MG tablet, TAKE 1 TABLET BY MOUTH TWICE DAILY., Disp:  180 tablet, Rfl: 0   pravastatin (PRAVACHOL) 20 MG tablet, TAKE 1 TABLET BY MOUTH DAILY., Disp: 90 tablet, Rfl: 0   traZODone (DESYREL) 50 MG tablet, TAKE 1 TABLET BY MOUTH AT BEDTIME., Disp: 90 tablet, Rfl: 0   venlafaxine XR (EFFEXOR-XR) 75 MG 24 hr capsule, TAKE 1 CAPSULE BY MOUTH 2 TIMES DAILY., Disp: 180 capsule, Rfl: 0   Allergies  Allergen Reactions   Sulfa Antibiotics Nausea Only and Other (See Comments)    Childhood reaction   CONSTITUTIONAL: Negative for chills, fatigue, fever, unintentional weight gain and unintentional weight loss.  E/N/T: Negative for ear pain, nasal congestion and sore throat.  CARDIOVASCULAR: Negative for chest pain, dizziness, palpitations and pedal edema.  RESPIRATORY: Negative for recent cough and dyspnea.  GASTROINTESTINAL: Negative for abdominal pain, acid reflux symptoms, constipation, diarrhea, nausea and vomiting.  MSK: Negative for arthralgias and myalgias.  INTEGUMENTARY: Negative for rash.  NEUROLOGICAL: Negative for dizziness and headaches.  PSYCHIATRIC: Negative for sleep disturbance and to question depression screen.  Negative for depression, negative for anhedonia.         Objective:  PHYSICAL EXAM:   VS: BP 128/62 (BP Location: Right Arm, Patient Position: Sitting)    Pulse 75    Temp 98.2 F (36.8 C) (Temporal)    Ht 5\' 5"  (1.651 m)    Wt 180 lb (81.6 kg)    SpO2 98%    BMI 29.95 kg/m   GEN: Well nourished, well developed, in no acute distress   Cardiac: RRR; no murmurs, rubs, or gallops,no edema -  Respiratory:  normal respiratory rate and pattern with no distress - normal breath sounds with no rales, rhonchi, wheezes or rubs GI: normal bowel sounds, no masses or tenderness MS: no deformity or atrophy  Skin: warm and dry, no rash  Neuro:  Alert and Oriented x 3, Strength and sensation are intact - CN II-Xii grossly intact Psych: euthymic mood, appropriate affect and demeanor    Health Maintenance Due  Topic Date Due   Zoster  Vaccines- Shingrix (2 of 2) 04/26/2018   Pneumonia Vaccine 52+ Years old (1 - PCV) Never done    There are no preventive care reminders to display for this patient.        Assessment & Plan:   Problem List Items Addressed This Visit       Cardiovascular and Mediastinum   Essential hypertension - Primary   Relevant Orders   CBC with Differential/Platelet   Comprehensive metabolic panel   TSH   Lipid panel     Digestive  GERD (gastroesophageal reflux disease) Continue current meds   Other Visit Diagnoses     Vitamin D insufficiency       Relevant Orders   VITAMIN D 25 Hydroxy (Vit-D Deficiency, Fractures)  Anxiety Continue current meds  Mixed hyperlipidemia Continue meds  Watch diet        No orders of the defined types were placed in this encounter.    SARA R Favio Moder, PA-C

## 2021-08-09 LAB — COMPREHENSIVE METABOLIC PANEL
ALT: 16 IU/L (ref 0–32)
AST: 20 IU/L (ref 0–40)
Albumin/Globulin Ratio: 1.3 (ref 1.2–2.2)
Albumin: 4.3 g/dL (ref 3.8–4.8)
Alkaline Phosphatase: 79 IU/L (ref 44–121)
BUN/Creatinine Ratio: 15 (ref 12–28)
BUN: 13 mg/dL (ref 8–27)
Bilirubin Total: 0.2 mg/dL (ref 0.0–1.2)
CO2: 25 mmol/L (ref 20–29)
Calcium: 9.5 mg/dL (ref 8.7–10.3)
Chloride: 104 mmol/L (ref 96–106)
Creatinine, Ser: 0.89 mg/dL (ref 0.57–1.00)
Globulin, Total: 3.2 g/dL (ref 1.5–4.5)
Glucose: 115 mg/dL — ABNORMAL HIGH (ref 70–99)
Potassium: 4.6 mmol/L (ref 3.5–5.2)
Sodium: 142 mmol/L (ref 134–144)
Total Protein: 7.5 g/dL (ref 6.0–8.5)
eGFR: 71 mL/min/{1.73_m2} (ref >59)

## 2021-08-09 LAB — LIPID PANEL
Chol/HDL Ratio: 2.7 ratio (ref 0.0–4.4)
Cholesterol, Total: 182 mg/dL (ref 100–199)
HDL: 67 mg/dL (ref >39)
LDL Chol Calc (NIH): 88 mg/dL (ref 0–99)
Triglycerides: 158 mg/dL — ABNORMAL HIGH (ref 0–149)
VLDL Cholesterol Cal: 27 mg/dL (ref 5–40)

## 2021-08-09 LAB — CARDIOVASCULAR RISK ASSESSMENT

## 2021-08-09 LAB — CBC WITH DIFFERENTIAL/PLATELET
Basophils Absolute: 0.1 10*3/uL (ref 0.0–0.2)
Basos: 1 %
EOS (ABSOLUTE): 0.4 10*3/uL (ref 0.0–0.4)
Eos: 6 %
Hematocrit: 41.8 % (ref 34.0–46.6)
Hemoglobin: 13.6 g/dL (ref 11.1–15.9)
Immature Grans (Abs): 0 10*3/uL (ref 0.0–0.1)
Immature Granulocytes: 0 %
Lymphocytes Absolute: 1.8 10*3/uL (ref 0.7–3.1)
Lymphs: 30 %
MCH: 26.9 pg (ref 26.6–33.0)
MCHC: 32.5 g/dL (ref 31.5–35.7)
MCV: 83 fL (ref 79–97)
Monocytes Absolute: 0.6 10*3/uL (ref 0.1–0.9)
Monocytes: 9 %
Neutrophils Absolute: 3.3 10*3/uL (ref 1.4–7.0)
Neutrophils: 54 %
Platelets: 260 10*3/uL (ref 150–450)
RBC: 5.06 x10E6/uL (ref 3.77–5.28)
RDW: 13.5 % (ref 11.7–15.4)
WBC: 6 10*3/uL (ref 3.4–10.8)

## 2021-08-09 LAB — VITAMIN D 25 HYDROXY (VIT D DEFICIENCY, FRACTURES): Vit D, 25-Hydroxy: 46.2 ng/mL (ref 30.0–100.0)

## 2021-08-09 LAB — TSH: TSH: 1.7 u[IU]/mL (ref 0.450–4.500)

## 2021-09-01 ENCOUNTER — Other Ambulatory Visit: Payer: Self-pay | Admitting: Physician Assistant

## 2021-10-17 ENCOUNTER — Other Ambulatory Visit: Payer: Self-pay | Admitting: Physician Assistant

## 2021-10-17 DIAGNOSIS — I10 Essential (primary) hypertension: Secondary | ICD-10-CM

## 2021-10-17 DIAGNOSIS — K219 Gastro-esophageal reflux disease without esophagitis: Secondary | ICD-10-CM

## 2021-10-17 DIAGNOSIS — F419 Anxiety disorder, unspecified: Secondary | ICD-10-CM

## 2022-01-09 ENCOUNTER — Other Ambulatory Visit: Payer: Self-pay | Admitting: Physician Assistant

## 2022-01-09 DIAGNOSIS — F419 Anxiety disorder, unspecified: Secondary | ICD-10-CM

## 2022-01-09 DIAGNOSIS — I10 Essential (primary) hypertension: Secondary | ICD-10-CM

## 2022-01-09 DIAGNOSIS — K219 Gastro-esophageal reflux disease without esophagitis: Secondary | ICD-10-CM

## 2022-02-03 ENCOUNTER — Other Ambulatory Visit: Payer: Self-pay | Admitting: Physician Assistant

## 2022-02-03 DIAGNOSIS — Z1231 Encounter for screening mammogram for malignant neoplasm of breast: Secondary | ICD-10-CM

## 2022-02-04 IMAGING — MG DIGITAL SCREENING BREAST BILAT IMPLANT W/ TOMO W/ CAD
9 of 14 series · 9 of 34 positions shown · non-contrast
Comparison: Previous exam(s).

CLINICAL DATA: Screening.

EXAM:
DIGITAL SCREENING BILATERAL MAMMOGRAM WITH IMPLANTS, CAD AND
TOMOSYNTHESIS
TECHNIQUE: Bilateral screening digital craniocaudal and mediolateral oblique
mammograms were obtained. Bilateral screening digital breast
tomosynthesis was performed. The images were evaluated with
computer-aided detection. Standard and/or implant displaced views
were performed.

[L CC]
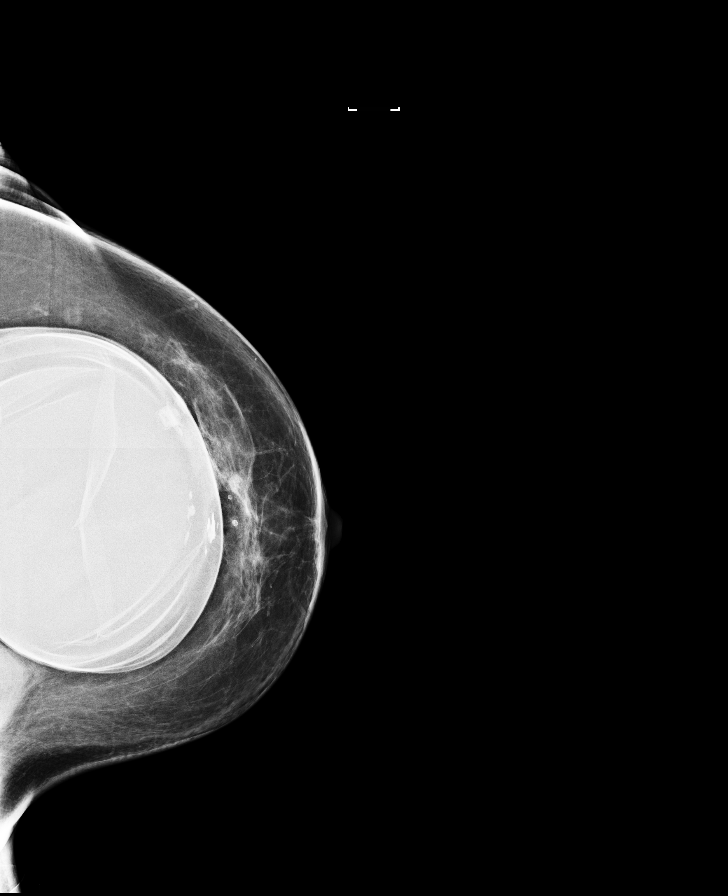

[R CC]
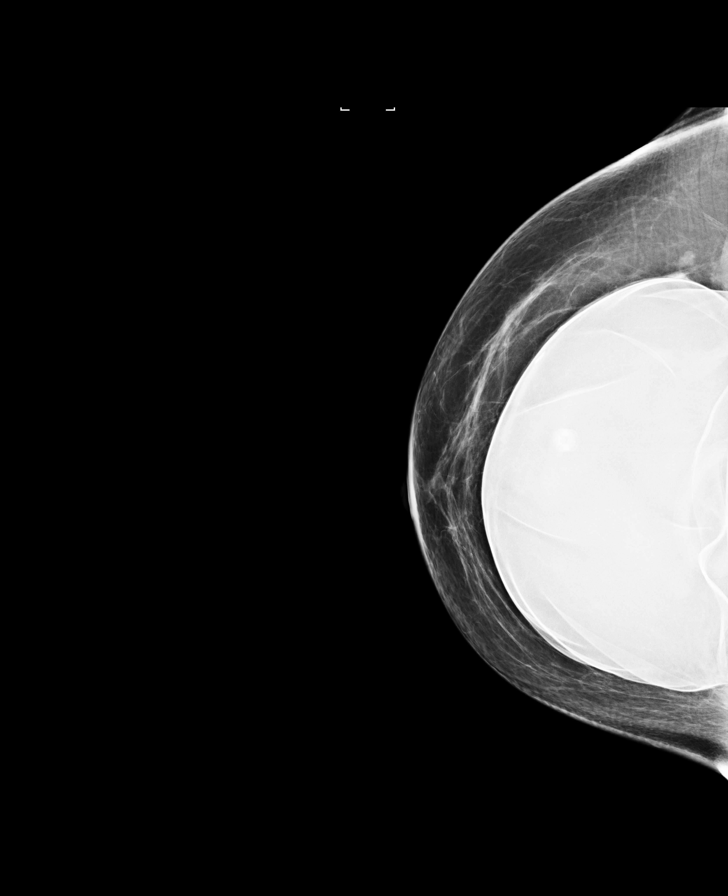

[L MLO]
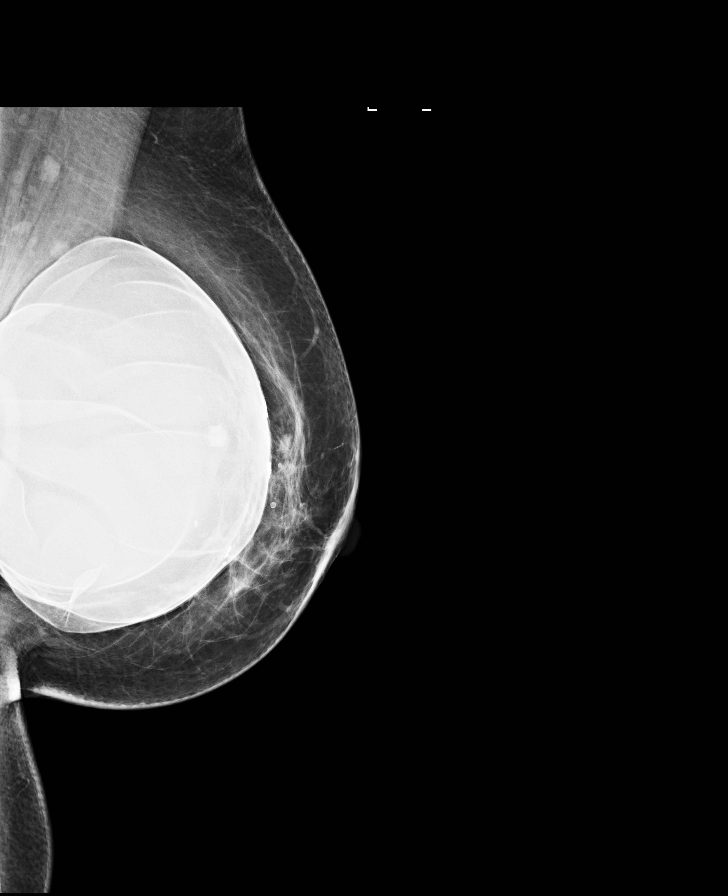

[R MLO]
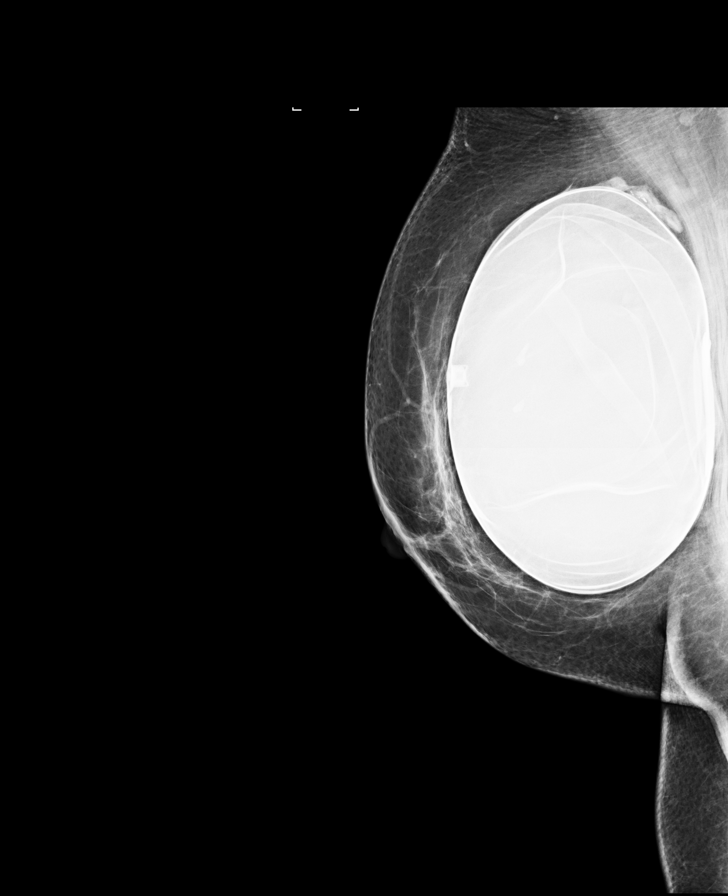

[R CC synth-2D]
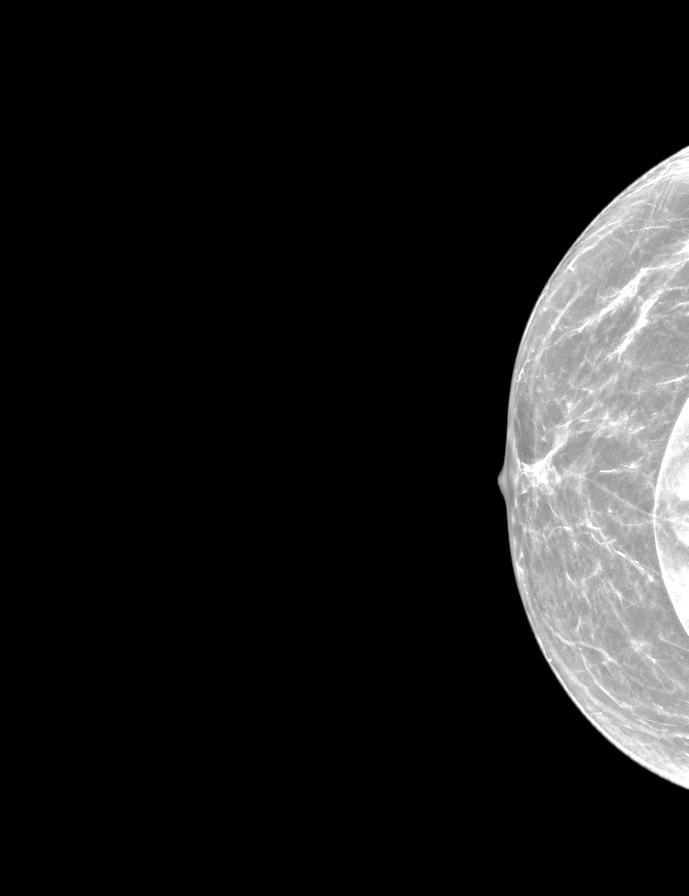

[R MLO synth-2D (1 of 2)]
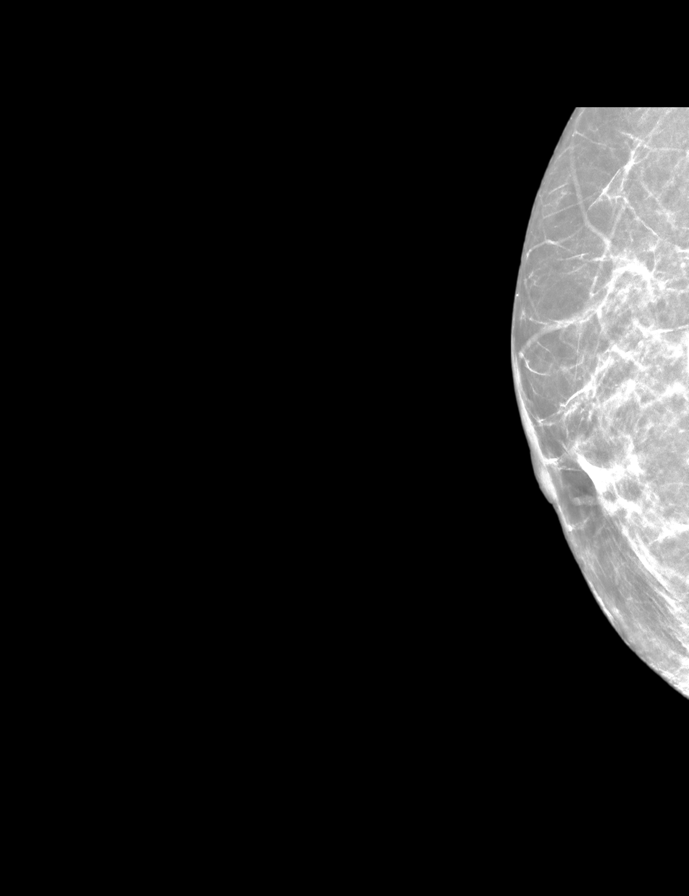

[R MLO synth-2D (2 of 2)]
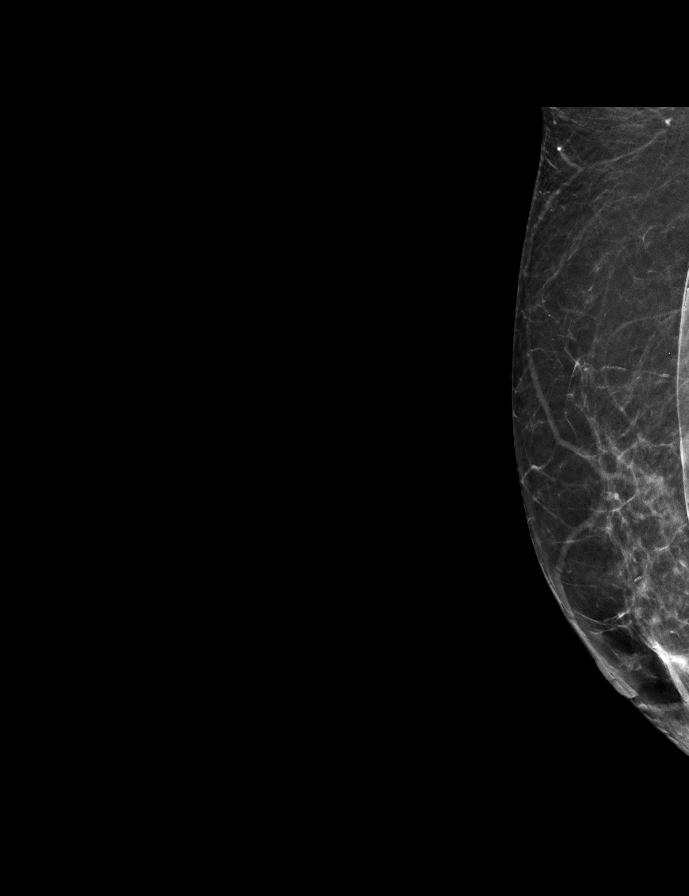

[L CC synth-2D]
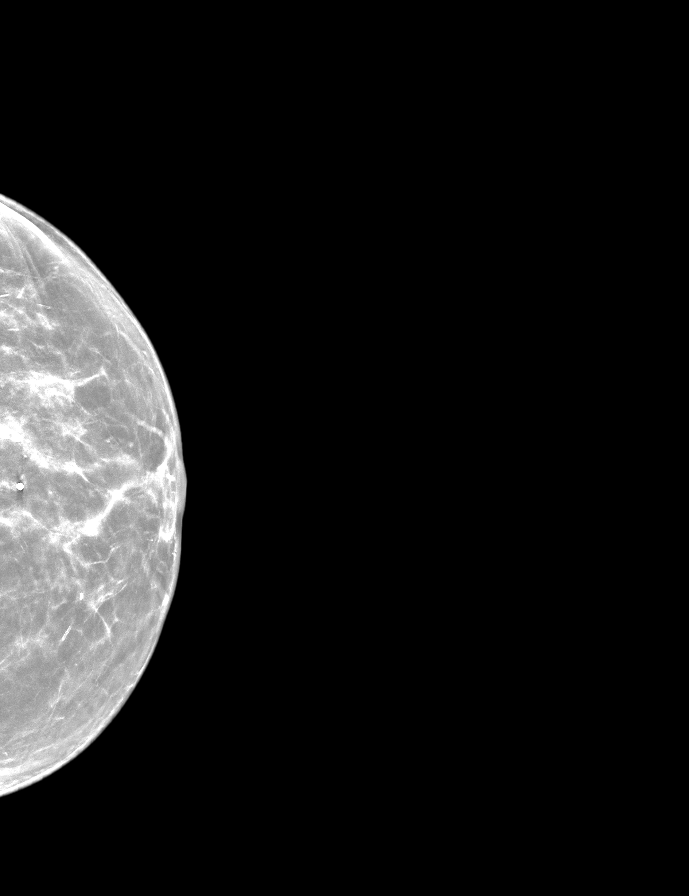

[L MLO synth-2D]
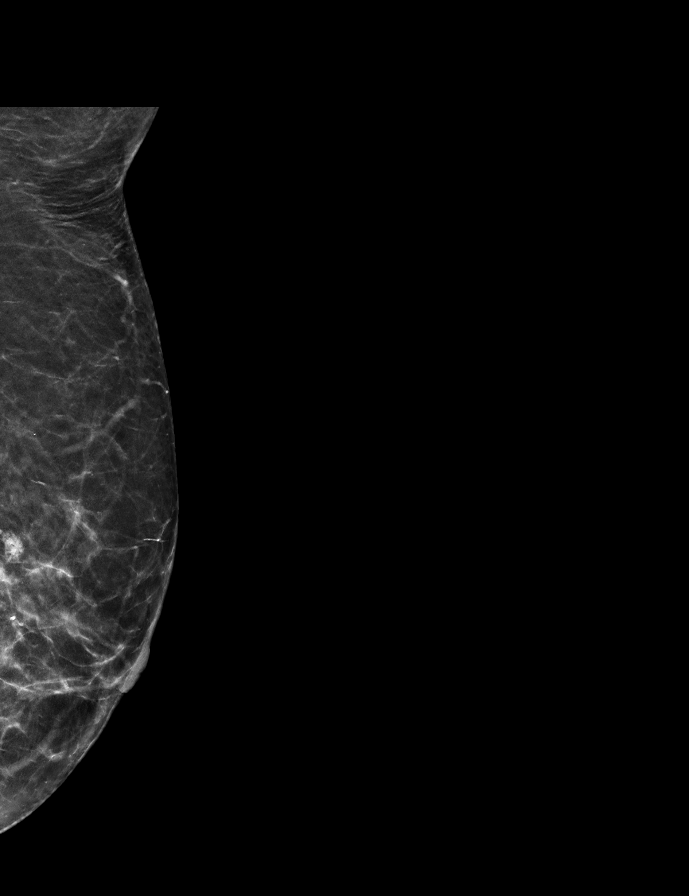

[9 of 34 positions shown; findings below may reference images not displayed]

ACR Breast Density Category b: There are scattered areas of
fibroglandular density.
FINDINGS: The patient has retropectoral implants. There are no findings
suspicious for malignancy.
IMPRESSION: No mammographic evidence of malignancy. A result letter of this
screening mammogram will be mailed directly to the patient.

RECOMMENDATION:
Screening mammogram in one year. (Code:SE-S-JMG)

BI-RADS CATEGORY  1:  Negative.

## 2022-02-06 ENCOUNTER — Encounter: Payer: Self-pay | Admitting: Physician Assistant

## 2022-02-06 ENCOUNTER — Ambulatory Visit (INDEPENDENT_AMBULATORY_CARE_PROVIDER_SITE_OTHER): Payer: Medicare Other | Admitting: Physician Assistant

## 2022-02-06 VITALS — BP 118/68 | HR 70 | Temp 96.9°F | Ht 65.0 in | Wt 180.2 lb

## 2022-02-06 DIAGNOSIS — F419 Anxiety disorder, unspecified: Secondary | ICD-10-CM

## 2022-02-06 DIAGNOSIS — K219 Gastro-esophageal reflux disease without esophagitis: Secondary | ICD-10-CM | POA: Diagnosis not present

## 2022-02-06 DIAGNOSIS — I10 Essential (primary) hypertension: Secondary | ICD-10-CM

## 2022-02-06 DIAGNOSIS — E782 Mixed hyperlipidemia: Secondary | ICD-10-CM

## 2022-02-06 DIAGNOSIS — E559 Vitamin D deficiency, unspecified: Secondary | ICD-10-CM

## 2022-02-06 DIAGNOSIS — J4520 Mild intermittent asthma, uncomplicated: Secondary | ICD-10-CM

## 2022-02-06 HISTORY — DX: Mixed hyperlipidemia: E78.2

## 2022-02-06 HISTORY — DX: Mild intermittent asthma, uncomplicated: J45.20

## 2022-02-06 HISTORY — DX: Vitamin D deficiency, unspecified: E55.9

## 2022-02-06 NOTE — Progress Notes (Signed)
Subjective:  Patient ID: Brittany Lawson, female    DOB: 1955-08-14  Age: 66 y.o. MRN: [id number]  Chief Complaint  Patient presents with   Hypertension    HPI  Pt presents for follow up of hypertension. The patient is tolerating the medication well without side effects. Compliance with treatment has been good; including taking medication as directed , maintains a healthy diet and regular exercise regimen , and following up as directed. She is currently on norvasc '5mg'$ g, cozaar '100mg'$   Mixed hyperlipidemia  Pt presents with hyperlipidemia.  Compliance with treatment has been good The patient is compliant with medications, maintains a low cholesterol diet , follows up as directed , and maintains an exercise regimen . The patient denies experiencing any hypercholesterolemia related symptoms. She is currenlty on pravachol '20mg'$  qd  Pt with history of asthma - symptoms stable and doing well on current medications - adviar, singulair, and proair  Pt with history of depression with anxiety = overall feeling well and medications working well for her - she scores 4 on PHQ today.  Currently on xanax, trazaodone and effexor  Pt with history of GERD - symptoms stable on protonix '40mg'$  qd   Current Outpatient Medications on File Prior to Visit  Medication Sig Dispense Refill   ADVAIR DISKUS 250-50 MCG/ACT AEPB INHALE 1 PUFF INTO THE LUNGS IN THE MORNING AND AT BEDTIME. 60 each 4   ALPRAZolam (XANAX) 0.5 MG tablet 1 po qd prn anxiety 30 tablet 1   amLODipine (NORVASC) 5 MG tablet TAKE 1 TABLET BY MOUTH DAILY 90 tablet 0   ibuprofen (ADVIL) 800 MG tablet Take 1 tablet (800 mg total) by mouth every 8 (eight) hours as needed for moderate pain. For AFTER surgery only 30 tablet 0   losartan (COZAAR) 100 MG tablet TAKE 1 TABLET BY MOUTH AT BEDTIME. 90 tablet 0   meclizine (ANTIVERT) 25 MG tablet Take 25 mg by mouth 3 (three) times daily as needed for dizziness.     montelukast (SINGULAIR) 10 MG tablet TAKE 1  TABLET BY MOUTH DAILY. 90 tablet 0   pantoprazole (PROTONIX) 40 MG tablet TAKE 1 TABLET BY MOUTH TWICE DAILY. 180 tablet 0   pravastatin (PRAVACHOL) 20 MG tablet TAKE 1 TABLET BY MOUTH DAILY. 90 tablet 0   PROAIR HFA 108 (90 Base) MCG/ACT inhaler INHALE 1 - 2 PUFFS EVERY 4 HOURS AS NEEDED 8.5 g 1   traZODone (DESYREL) 50 MG tablet TAKE 1 TABLET BY MOUTH AT BEDTIME. 90 tablet 0   venlafaxine XR (EFFEXOR-XR) 75 MG 24 hr capsule TAKE 1 CAPSULE BY MOUTH 2 TIMES DAILY. 180 capsule 0   No current facility-administered medications on file prior to visit.   Past Medical History:  Diagnosis Date   Asthma    Blood clot in vein yrs ago   x 2 right leg   Colon polyps 2019   Constipation    Depression    GERD (gastroesophageal reflux disease)    History of kidney stones 2020/04/23   "passed on own"   Hypertension    Left ovarian cyst    Varicose veins of both lower extremities    Past Surgical History:  Procedure Laterality Date   AUGMENTATION MAMMAPLASTY Bilateral age 78's age 4's     each redone 2018 right side calcification removed   BREAST EXCISIONAL BIOPSY Right    APPROX 20 YEARS AGO   CHOLECYSTECTOMY  age 75   lapa   colonscopy  10-2019 and 05-2020  polyp removed both times due may 2022   ROBOTIC ASSISTED TOTAL HYSTERECTOMY WITH BILATERAL SALPINGO OOPHERECTOMY N/A 09/15/2020   Procedure: XI ROBOTIC ASSISTED TOTAL HYSTERECTOMY WITH BILATERAL SALPINGO OOPHORECTOMY;  Surgeon: Everitt Amber, MD;  Location: [facility];  Service: Gynecology;  Laterality: N/A;   TUBAL LIGATION  age 79   UPPER GI ENDOSCOPY  05/2020    Family History  Problem Relation Age of Onset   Breast cancer Mother 46   Social History   Socioeconomic History   Marital status: Married    Spouse name: Not on file   Number of children: 4   Years of education: Not on file   Highest education level: Not on file  Occupational History   Occupation: homemaker  Tobacco Use   Smoking status: Former     Packs/day: 1.50    Years: 20.00    Total pack years: 30.00    Types: Cigarettes   Smokeless tobacco: Never   Tobacco comments:    quit 25 yrs ago  Vaping Use   Vaping Use: Never used  Substance and Sexual Activity   Alcohol use: Not Currently   Drug use: Never   Sexual activity: Not Currently  Other Topics Concern   Not on file  Social History Narrative   Not on file   Social Determinants of Health   Financial Resource Strain: Not on file  Food Insecurity: Not on file  Transportation Needs: Not on file  Physical Activity: Not on file  Stress: Not on file  Social Connections: Not on file    Review of Systems CONSTITUTIONAL: Negative for chills, fatigue, fever, unintentional weight gain and unintentional weight loss.  E/N/T: Negative for ear pain, nasal congestion and sore throat.  CARDIOVASCULAR: Negative for chest pain, dizziness, palpitations and pedal edema.  RESPIRATORY: Negative for recent cough and dyspnea.  GASTROINTESTINAL: Negative for abdominal pain, acid reflux symptoms, constipation, diarrhea, nausea and vomiting.  MSK: Negative for arthralgias and myalgias.  INTEGUMENTARY: Negative for rash.  NEUROLOGICAL: Negative for dizziness and headaches.  PSYCHIATRIC: Negative for sleep disturbance and to question depression screen.  Negative for depression, negative for anhedonia.       Objective:  PHYSICAL EXAM:   VS: BP 118/68 (BP Location: Left Arm, Patient Position: Sitting, Cuff Size: Large)   Pulse 70   Temp (!) 96.9 F (36.1 C) (Temporal)   Ht '5\' 5"'$  (1.651 m)   Wt 180 lb 3.2 oz (81.7 kg)   SpO2 96%   BMI 29.99 kg/m   GEN: Well nourished, well developed, in no acute distress   Cardiac: RRR; no murmurs, rubs, or gallops,no edema - no significant varicosities Respiratory:  normal respiratory rate and pattern with no distress - normal breath sounds with no rales, rhonchi, wheezes or rubs  MS: no deformity or atrophy  Skin: warm and dry, no rash    Psych: euthymic mood, appropriate affect and demeanor   Lab Results  Component Value Date   WBC 6.0 08/08/2021   HGB 13.6 08/08/2021   HCT 41.8 08/08/2021   PLT 260 08/08/2021   GLUCOSE 115 (H) 08/08/2021   CHOL 182 08/08/2021   TRIG 158 (H) 08/08/2021   HDL 67 08/08/2021   LDLCALC 88 08/08/2021   ALT 16 08/08/2021   AST 20 08/08/2021   NA 142 08/08/2021   K 4.6 08/08/2021   CL 104 08/08/2021   CREATININE 0.89 08/08/2021   BUN 13 08/08/2021   CO2 25 08/08/2021   TSH 1.700 08/08/2021  Assessment & Plan:   Problem List Items Addressed This Visit       Cardiovascular and Mediastinum   Essential hypertension - Primary   Relevant Orders   CBC with Differential/Platelet   Comprehensive metabolic panel   TSH Continue meds     Respiratory   Mild intermittent asthma without complication Continue meds     Digestive   Gastroesophageal reflux disease without esophagitis Continue protonix     Other   Anxiety Continue meds   Vitamin D insufficiency   Relevant Orders   VITAMIN D 25 Hydroxy (Vit-D Deficiency, Fractures)   Mixed hyperlipidemia   Relevant Orders   Lipid panel Continue meds and watch diet  .  No orders of the defined types were placed in this encounter.   Orders Placed This Encounter  Procedures   CBC with Differential/Platelet   Comprehensive metabolic panel   TSH   Lipid panel   VITAMIN D 25 Hydroxy (Vit-D Deficiency, Fractures)     Follow-up: Return in about 6 months (around 08/09/2022) for chronic fasting follow up.  An After Visit Summary was printed and given to the patient.  Yetta Flock [facility] [phone]

## 2022-02-07 ENCOUNTER — Other Ambulatory Visit: Payer: Self-pay | Admitting: Physician Assistant

## 2022-02-07 DIAGNOSIS — R739 Hyperglycemia, unspecified: Secondary | ICD-10-CM

## 2022-02-07 LAB — CBC WITH DIFFERENTIAL/PLATELET
Basophils Absolute: 0.1 10*3/uL (ref 0.0–0.2)
Basos: 1 %
EOS (ABSOLUTE): 0.3 10*3/uL (ref 0.0–0.4)
Eos: 5 %
Hematocrit: 42.6 % (ref 34.0–46.6)
Hemoglobin: 13.9 g/dL (ref 11.1–15.9)
Immature Grans (Abs): 0 10*3/uL (ref 0.0–0.1)
Immature Granulocytes: 0 %
Lymphocytes Absolute: 2.2 10*3/uL (ref 0.7–3.1)
Lymphs: 35 %
MCH: 26.5 pg — ABNORMAL LOW (ref 26.6–33.0)
MCHC: 32.6 g/dL (ref 31.5–35.7)
MCV: 81 fL (ref 79–97)
Monocytes Absolute: 0.5 10*3/uL (ref 0.1–0.9)
Monocytes: 8 %
Neutrophils Absolute: 3.1 10*3/uL (ref 1.4–7.0)
Neutrophils: 51 %
Platelets: 251 10*3/uL (ref 150–450)
RBC: 5.25 x10E6/uL (ref 3.77–5.28)
RDW: 12.7 % (ref 11.7–15.4)
WBC: 6.2 10*3/uL (ref 3.4–10.8)

## 2022-02-07 LAB — COMPREHENSIVE METABOLIC PANEL
ALT: 16 IU/L (ref 0–32)
AST: 19 IU/L (ref 0–40)
Albumin/Globulin Ratio: 1.3 (ref 1.2–2.2)
Albumin: 4.4 g/dL (ref 3.9–4.9)
Alkaline Phosphatase: 78 IU/L (ref 44–121)
BUN/Creatinine Ratio: 14 (ref 12–28)
BUN: 15 mg/dL (ref 8–27)
Bilirubin Total: 0.3 mg/dL (ref 0.0–1.2)
CO2: 23 mmol/L (ref 20–29)
Calcium: 9.9 mg/dL (ref 8.7–10.3)
Chloride: 102 mmol/L (ref 96–106)
Creatinine, Ser: 1.04 mg/dL — ABNORMAL HIGH (ref 0.57–1.00)
Globulin, Total: 3.3 g/dL (ref 1.5–4.5)
Glucose: 123 mg/dL — ABNORMAL HIGH (ref 70–99)
Potassium: 4.1 mmol/L (ref 3.5–5.2)
Sodium: 140 mmol/L (ref 134–144)
Total Protein: 7.7 g/dL (ref 6.0–8.5)
eGFR: 59 mL/min/{1.73_m2} — ABNORMAL LOW (ref >59)

## 2022-02-07 LAB — LIPID PANEL
Chol/HDL Ratio: 2.8 ratio (ref 0.0–4.4)
Cholesterol, Total: 196 mg/dL (ref 100–199)
HDL: 70 mg/dL (ref >39)
LDL Chol Calc (NIH): 105 mg/dL — ABNORMAL HIGH (ref 0–99)
Triglycerides: 122 mg/dL (ref 0–149)
VLDL Cholesterol Cal: 21 mg/dL (ref 5–40)

## 2022-02-07 LAB — CARDIOVASCULAR RISK ASSESSMENT

## 2022-02-07 LAB — VITAMIN D 25 HYDROXY (VIT D DEFICIENCY, FRACTURES): Vit D, 25-Hydroxy: 63.1 ng/mL (ref 30.0–100.0)

## 2022-02-07 LAB — TSH: TSH: 2.04 u[IU]/mL (ref 0.450–4.500)

## 2022-02-08 LAB — HGB A1C W/O EAG: Hgb A1c MFr Bld: 5.9 % — ABNORMAL HIGH (ref 4.8–5.6)

## 2022-02-08 LAB — SPECIMEN STATUS REPORT

## 2022-02-20 ENCOUNTER — Ambulatory Visit
Admission: RE | Admit: 2022-02-20 | Discharge: 2022-02-20 | Disposition: A | Discharge status: Home / Self Care | Payer: Medicare Other | Source: Ambulatory Visit | Attending: Physician Assistant | Admitting: Physician Assistant

## 2022-02-20 DIAGNOSIS — Z1231 Encounter for screening mammogram for malignant neoplasm of breast: Secondary | ICD-10-CM

## 2022-03-27 ENCOUNTER — Telehealth: Payer: Self-pay

## 2022-03-27 NOTE — Telephone Encounter (Signed)
Called patient to schedule AWV- we only have afternoon appointments- will call her back when a.m. appointments become available.   Gentry Fitz, RN [facility] 03/27/2022 2:12 PM

## 2022-03-29 ENCOUNTER — Telehealth: Payer: Self-pay

## 2022-03-29 NOTE — Patient Outreach (Signed)
  Care Coordination   Initial Visit Note   03/29/2022 Name: KYLEI PURINGTON MRN: [id number] DOB: [birth year]  JYNESIS NAKAMURA is a 66 y.o. year old female who sees Marge Duncans, [place] for primary care. I spoke with  Adalberto Cole by phone today.  What matters to the patients health and wellness today?  Placed call to patient to review [facility] care coordination program.  Patient report she is doing well and is not interested.     SDOH assessments and interventions completed:  No     Care Coordination Interventions Activated:  No  Care Coordination Interventions:  No, not indicated   Follow up plan: No further intervention required.   Encounter Outcome:  Pt. Refused   Tomasa Rand, RN, BSN, CEN [facility] [employer] [phone]

## 2022-04-17 ENCOUNTER — Other Ambulatory Visit: Payer: Self-pay | Admitting: Physician Assistant

## 2022-04-17 DIAGNOSIS — F419 Anxiety disorder, unspecified: Secondary | ICD-10-CM

## 2022-04-17 DIAGNOSIS — I10 Essential (primary) hypertension: Secondary | ICD-10-CM

## 2022-04-17 DIAGNOSIS — K219 Gastro-esophageal reflux disease without esophagitis: Secondary | ICD-10-CM

## 2022-05-09 ENCOUNTER — Other Ambulatory Visit: Payer: Self-pay | Admitting: Physician Assistant

## 2022-07-25 ENCOUNTER — Other Ambulatory Visit: Payer: Self-pay | Admitting: Physician Assistant

## 2022-07-25 DIAGNOSIS — I10 Essential (primary) hypertension: Secondary | ICD-10-CM

## 2022-07-25 DIAGNOSIS — F419 Anxiety disorder, unspecified: Secondary | ICD-10-CM

## 2022-07-25 DIAGNOSIS — K219 Gastro-esophageal reflux disease without esophagitis: Secondary | ICD-10-CM

## 2022-08-16 ENCOUNTER — Encounter: Payer: Self-pay | Admitting: Physician Assistant

## 2022-08-16 ENCOUNTER — Ambulatory Visit (INDEPENDENT_AMBULATORY_CARE_PROVIDER_SITE_OTHER): Payer: Medicare Other | Admitting: Physician Assistant

## 2022-08-16 VITALS — BP 124/80 | HR 76 | Temp 98.1°F | Resp 14 | Ht 65.0 in | Wt 186.0 lb

## 2022-08-16 DIAGNOSIS — E782 Mixed hyperlipidemia: Secondary | ICD-10-CM | POA: Diagnosis not present

## 2022-08-16 DIAGNOSIS — M8588 Other specified disorders of bone density and structure, other site: Secondary | ICD-10-CM | POA: Diagnosis not present

## 2022-08-16 DIAGNOSIS — I10 Essential (primary) hypertension: Secondary | ICD-10-CM | POA: Diagnosis not present

## 2022-08-16 DIAGNOSIS — K219 Gastro-esophageal reflux disease without esophagitis: Secondary | ICD-10-CM

## 2022-08-16 DIAGNOSIS — R1032 Left lower quadrant pain: Secondary | ICD-10-CM | POA: Diagnosis not present

## 2022-08-16 DIAGNOSIS — F419 Anxiety disorder, unspecified: Secondary | ICD-10-CM

## 2022-08-16 DIAGNOSIS — E559 Vitamin D deficiency, unspecified: Secondary | ICD-10-CM

## 2022-08-16 DIAGNOSIS — R739 Hyperglycemia, unspecified: Secondary | ICD-10-CM

## 2022-08-16 LAB — POCT URINALYSIS DIP (CLINITEK)
Blood, UA: NEGATIVE
Glucose, UA: NEGATIVE mg/dL
Ketones, POC UA: NEGATIVE mg/dL
Leukocytes, UA: NEGATIVE
Nitrite, UA: NEGATIVE
POC PROTEIN,UA: NEGATIVE
Spec Grav, UA: 1.015 (ref 1.010–1.025)
Urobilinogen, UA: 0.2 E.U./dL
pH, UA: 6.5 (ref 5.0–8.0)

## 2022-08-16 NOTE — Progress Notes (Signed)
Subjective:  Patient ID: Brittany Lawson, female    DOB: 1955/10/26  Age: 67 y.o. MRN: [id number]  Chief Complaint  Patient presents with   Hypertension   Hyperlipidemia      Pt presents for follow up of hypertension. The patient is tolerating the medication well without side effects. Compliance with treatment has been good; including taking medication as directed , maintains a healthy diet and regular exercise regimen , and following up as directed. She is currently on norvasc '5mg'$ , cozaar '100mg'$   Mixed hyperlipidemia  Pt presents with hyperlipidemia.  Compliance with treatment has been good The patient is compliant with medications, maintains a low cholesterol diet , follows up as directed , and maintains an exercise regimen . The patient denies experiencing any hypercholesterolemia related symptoms. She is currenlty on pravachol '20mg'$  qd  Pt with history of asthma - symptoms stable and doing well on current medications - adviar, singulair, and proair  Pt with history of depression with anxiety = overall feeling well and medications working well for her - she scores 4 on PHQ today.  Currently on xanax, trazaodone and effexor  Pt with history of GERD - symptoms stable on protonix '40mg'$  qd However she states that for the past 2 months or more she wakes up with pain and pressure in her lower abdomen that gradually resolves after getting up and moving around.  Denies urine or bowel problems.  No melena or hematochezia  Current Outpatient Medications on File Prior to Visit  Medication Sig Dispense Refill   ALPRAZolam (XANAX) 0.5 MG tablet 1 po qd prn anxiety 30 tablet 1   amLODipine (NORVASC) 5 MG tablet TAKE 1 TABLET BY MOUTH DAILY 90 tablet 0   fluticasone-salmeterol (ADVAIR) 250-50 MCG/ACT AEPB INHALE 1 PUFF INTO THE LUNGS IN THE MORNING AND AT BEDTIME. 60 each 3   ibuprofen (ADVIL) 800 MG tablet Take 1 tablet (800 mg total) by mouth every 8 (eight) hours as needed for moderate pain. For  AFTER surgery only 30 tablet 0   losartan (COZAAR) 100 MG tablet TAKE 1 TABLET BY MOUTH AT BEDTIME. 90 tablet 0   meclizine (ANTIVERT) 25 MG tablet Take 25 mg by mouth 3 (three) times daily as needed for dizziness.     montelukast (SINGULAIR) 10 MG tablet TAKE 1 TABLET BY MOUTH DAILY. 90 tablet 0   pantoprazole (PROTONIX) 40 MG tablet TAKE 1 TABLET BY MOUTH TWICE DAILY. 180 tablet 0   pravastatin (PRAVACHOL) 20 MG tablet TAKE 1 TABLET BY MOUTH DAILY. 90 tablet 0   PROAIR HFA 108 (90 Base) MCG/ACT inhaler INHALE 1 - 2 PUFFS EVERY 4 HOURS AS NEEDED 8.5 g 1   traZODone (DESYREL) 50 MG tablet TAKE 1 TABLET BY MOUTH AT BEDTIME. 90 tablet 0   venlafaxine XR (EFFEXOR-XR) 75 MG 24 hr capsule TAKE 1 CAPSULE BY MOUTH 2 TIMES DAILY. 180 capsule 0   No current facility-administered medications on file prior to visit.   Past Medical History:  Diagnosis Date   Asthma    Blood clot in vein yrs ago   x 2 right leg   Colon polyps 2019   Constipation    Depression    GERD (gastroesophageal reflux disease)    History of kidney stones April 11, 2020   "passed on own"   Hypertension    Left ovarian cyst    Varicose veins of both lower extremities    Past Surgical History:  Procedure Laterality Date   AUGMENTATION MAMMAPLASTY Bilateral age 28's  age 75's     each redone 2018 right side calcification removed   BREAST EXCISIONAL BIOPSY Right    APPROX 20 YEARS AGO   CHOLECYSTECTOMY  age 27   lapa   colonscopy  10-2019 and 05-2020   polyp removed both times due may 2022   ROBOTIC ASSISTED TOTAL HYSTERECTOMY WITH BILATERAL SALPINGO OOPHERECTOMY N/A 09/15/2020   Procedure: XI ROBOTIC ASSISTED TOTAL HYSTERECTOMY WITH BILATERAL SALPINGO OOPHORECTOMY;  Surgeon: Everitt Amber, MD;  Location: [facility];  Service: Gynecology;  Laterality: N/A;   TUBAL LIGATION  age 64   UPPER GI ENDOSCOPY  05/2020    Family History  Problem Relation Age of Onset   Breast cancer Mother 3   Social History    Socioeconomic History   Marital status: Married    Spouse name: Not on file   Number of children: 4   Years of education: Not on file   Highest education level: Not on file  Occupational History   Occupation: homemaker  Tobacco Use   Smoking status: Former    Packs/day: 1.50    Years: 20.00    Total pack years: 30.00    Types: Cigarettes    Quit date: 1996    Years since quitting: 28.1   Smokeless tobacco: Never   Tobacco comments:    quit 25 yrs ago  Vaping Use   Vaping Use: Never used  Substance and Sexual Activity   Alcohol use: Not Currently   Drug use: Never   Sexual activity: Yes    Partners: Male  Other Topics Concern   Not on file  Social History Narrative   Not on file   Social Determinants of Health   Financial Resource Strain: Not on file  Food Insecurity: Not on file  Transportation Needs: Not on file  Physical Activity: Not on file  Stress: Not on file  Social Connections: Not on file    CONSTITUTIONAL: Negative for chills, fatigue, fever, unintentional weight gain and unintentional weight loss.  E/N/T: Negative for ear pain, nasal congestion and sore throat.  CARDIOVASCULAR: Negative for chest pain, dizziness, palpitations and pedal edema.  RESPIRATORY: Negative for recent cough and dyspnea.  GASTROINTESTINAL: see HPI MSK: Negative for arthralgias and myalgias.  INTEGUMENTARY: Negative for rash.  NEUROLOGICAL: Negative for dizziness and headaches.  PSYCHIATRIC: Negative for sleep disturbance and to question depression screen.  Negative for depression, negative for anhedonia.           Objective:  PHYSICAL EXAM:   VS: BP 124/80   Pulse 76   Temp 98.1 F (36.7 C)   Resp 14   Ht '5\' 5"'$  (1.651 m)   Wt 186 lb (84.4 kg)   SpO2 97%   BMI 30.95 kg/m   GEN: Well nourished, well developed, in no acute distress  Cardiac: RRR; no murmurs, rubs, or gallops,no edema -  Respiratory:  normal respiratory rate and pattern with no distress - normal  breath sounds with no rales, rhonchi, wheezes or rubs GI: normal bowel sounds, with mild LLQ tenderness to palpation MS: no deformity or atrophy  Skin: warm and dry, no rash  Psych: euthymic mood, appropriate affect and demeanor Office Visit on 08/16/2022  Component Date Value Ref Range Status   Color, UA 08/16/2022 yellow  yellow Final   Clarity, UA 08/16/2022 clear  clear Final   Glucose, UA 08/16/2022 negative  negative mg/dL Final   Bilirubin, UA 08/16/2022 small (A)  negative Final   Ketones, POC UA  08/16/2022 negative  negative mg/dL Final   Spec Grav, UA 08/16/2022 1.015  1.010 - 1.025 Final   Blood, UA 08/16/2022 negative  negative Final   pH, UA 08/16/2022 6.5  5.0 - 8.0 Final   POC PROTEIN,UA 08/16/2022 negative  negative, trace Final   Urobilinogen, UA 08/16/2022 0.2  0.2 or 1.0 E.U./dL Final   Nitrite, UA 08/16/2022 Negative  Negative Final   Leukocytes, UA 08/16/2022 Negative  Negative Final     Lab Results  Component Value Date   WBC 6.2 02/06/2022   HGB 13.9 02/06/2022   HCT 42.6 02/06/2022   PLT 251 02/06/2022   GLUCOSE 123 (H) 02/06/2022   CHOL 196 02/06/2022   TRIG 122 02/06/2022   HDL 70 02/06/2022   LDLCALC 105 (H) 02/06/2022   ALT 16 02/06/2022   AST 19 02/06/2022   NA 140 02/06/2022   K 4.1 02/06/2022   CL 102 02/06/2022   CREATININE 1.04 (H) 02/06/2022   BUN 15 02/06/2022   CO2 23 02/06/2022   TSH 2.040 02/06/2022   HGBA1C 5.9 (H) 02/06/2022      Assessment & Plan:   Problem List Items Addressed This Visit       Cardiovascular and Mediastinum   Essential hypertension - Primary   Relevant Orders   CBC with Differential/Platelet   Comprehensive metabolic panel   TSH Continue meds     Respiratory   Mild intermittent asthma without complication Continue meds     Digestive   Gastroesophageal reflux disease without esophagitis Continue protonix     Other   Anxiety Continue meds   Vitamin D insufficiency   Relevant Orders    VITAMIN D 25 Hydroxy (Vit-D Deficiency, Fractures)   Mixed hyperlipidemia   Relevant Orders   Lipid panel Continue meds and watch   LLQ abdominal pain Order CT abdomen/pelvis with contrast  .  No orders of the defined types were placed in this encounter.   Orders Placed This Encounter  Procedures   CBC with Differential/Platelet   Comprehensive metabolic panel   TSH   Lipid panel   Hemoglobin A1c   VITAMIN D 25 Hydroxy (Vit-D Deficiency, Fractures)   POCT URINALYSIS DIP (CLINITEK)     Follow-up: Return in about 6 months (around 02/14/2023) for chronic fasting follow up.  An After Visit Summary was printed and given to the patient.  Yetta Flock [facility] [phone]

## 2022-08-17 LAB — CBC WITH DIFFERENTIAL/PLATELET
Basophils Absolute: 0.1 10*3/uL (ref 0.0–0.2)
Basos: 1 %
EOS (ABSOLUTE): 0.3 10*3/uL (ref 0.0–0.4)
Eos: 4 %
Hematocrit: 43.6 % (ref 34.0–46.6)
Hemoglobin: 14.3 g/dL (ref 11.1–15.9)
Immature Grans (Abs): 0 10*3/uL (ref 0.0–0.1)
Immature Granulocytes: 0 %
Lymphocytes Absolute: 2.1 10*3/uL (ref 0.7–3.1)
Lymphs: 30 %
MCH: 26.4 pg — ABNORMAL LOW (ref 26.6–33.0)
MCHC: 32.8 g/dL (ref 31.5–35.7)
MCV: 80 fL (ref 79–97)
Monocytes Absolute: 0.6 10*3/uL (ref 0.1–0.9)
Monocytes: 8 %
Neutrophils Absolute: 4 10*3/uL (ref 1.4–7.0)
Neutrophils: 57 %
Platelets: 288 10*3/uL (ref 150–450)
RBC: 5.42 x10E6/uL — ABNORMAL HIGH (ref 3.77–5.28)
RDW: 12.8 % (ref 11.7–15.4)
WBC: 7.1 10*3/uL (ref 3.4–10.8)

## 2022-08-17 LAB — COMPREHENSIVE METABOLIC PANEL
ALT: 17 IU/L (ref 0–32)
AST: 20 IU/L (ref 0–40)
Albumin/Globulin Ratio: 1.4 (ref 1.2–2.2)
Albumin: 4.5 g/dL (ref 3.9–4.9)
Alkaline Phosphatase: 88 IU/L (ref 44–121)
BUN/Creatinine Ratio: 12 (ref 12–28)
BUN: 11 mg/dL (ref 8–27)
Bilirubin Total: 0.3 mg/dL (ref 0.0–1.2)
CO2: 22 mmol/L (ref 20–29)
Calcium: 10 mg/dL (ref 8.7–10.3)
Chloride: 101 mmol/L (ref 96–106)
Creatinine, Ser: 0.95 mg/dL (ref 0.57–1.00)
Globulin, Total: 3.2 g/dL (ref 1.5–4.5)
Glucose: 116 mg/dL — ABNORMAL HIGH (ref 70–99)
Potassium: 4.8 mmol/L (ref 3.5–5.2)
Sodium: 141 mmol/L (ref 134–144)
Total Protein: 7.7 g/dL (ref 6.0–8.5)
eGFR: 66 mL/min/{1.73_m2} (ref >59)

## 2022-08-17 LAB — HEMOGLOBIN A1C
Est. average glucose Bld gHb Est-mCnc: 131 mg/dL
Hgb A1c MFr Bld: 6.2 % — ABNORMAL HIGH (ref 4.8–5.6)

## 2022-08-17 LAB — LIPID PANEL
Chol/HDL Ratio: 2.8 ratio (ref 0.0–4.4)
Cholesterol, Total: 206 mg/dL — ABNORMAL HIGH (ref 100–199)
HDL: 74 mg/dL (ref >39)
LDL Chol Calc (NIH): 110 mg/dL — ABNORMAL HIGH (ref 0–99)
Triglycerides: 126 mg/dL (ref 0–149)
VLDL Cholesterol Cal: 22 mg/dL (ref 5–40)

## 2022-08-17 LAB — TSH: TSH: 2.64 u[IU]/mL (ref 0.450–4.500)

## 2022-08-17 LAB — VITAMIN D 25 HYDROXY (VIT D DEFICIENCY, FRACTURES): Vit D, 25-Hydroxy: 36.6 ng/mL (ref 30.0–100.0)

## 2022-08-17 LAB — CARDIOVASCULAR RISK ASSESSMENT

## 2022-08-24 ENCOUNTER — Ambulatory Visit
Admission: RE | Admit: 2022-08-24 | Discharge: 2022-08-24 | Disposition: A | Discharge status: Home / Self Care | Payer: Medicare Other | Source: Ambulatory Visit | Attending: Physician Assistant | Admitting: Physician Assistant

## 2022-08-24 DIAGNOSIS — R1032 Left lower quadrant pain: Secondary | ICD-10-CM

## 2022-08-24 MED ORDER — IOPAMIDOL (ISOVUE-300) INJECTION 61%
100.0000 mL | Freq: Once | INTRAVENOUS | Status: AC | PRN
  Administered 2022-08-24: 100 mL via INTRAVENOUS

## 2022-08-28 ENCOUNTER — Telehealth: Payer: Self-pay

## 2022-08-28 NOTE — Telephone Encounter (Signed)
Patient was notified of CT scan results.  She has been leaking urine or clear drainage after the CT scan.  Her temp was 99.5. She denies urinary frequency or burning.  She continues to have some abdominal discomfort but it is not as severe.  What do you recommend?  She does not have an appointment with GI.

## 2022-08-28 NOTE — Telephone Encounter (Signed)
Patient was notified.  She will call us back for GI referral if needed and if she has further issues with urine leakage.

## 2022-10-17 ENCOUNTER — Other Ambulatory Visit: Payer: Self-pay | Admitting: Physician Assistant

## 2022-10-17 DIAGNOSIS — F419 Anxiety disorder, unspecified: Secondary | ICD-10-CM

## 2022-10-17 DIAGNOSIS — I10 Essential (primary) hypertension: Secondary | ICD-10-CM

## 2022-10-17 DIAGNOSIS — K219 Gastro-esophageal reflux disease without esophagitis: Secondary | ICD-10-CM

## 2023-01-02 ENCOUNTER — Telehealth: Payer: Self-pay | Admitting: Physician Assistant

## 2023-01-02 NOTE — Telephone Encounter (Signed)
Contacted Brittany Lawson to schedule their annual wellness visit. Appointment made for 01/03/2023 @ 10:00 AM.

## 2023-01-03 ENCOUNTER — Ambulatory Visit (INDEPENDENT_AMBULATORY_CARE_PROVIDER_SITE_OTHER): Payer: Medicare Other

## 2023-01-03 DIAGNOSIS — Z0000 Encounter for general adult medical examination without abnormal findings: Secondary | ICD-10-CM | POA: Diagnosis not present

## 2023-01-03 NOTE — Patient Instructions (Signed)
Brittany Lawson , Thank you for taking time to come for your Medicare Wellness Visit. I appreciate your ongoing commitment to your health goals. Please review the following plan we discussed and let me know if I can assist you in the future.     This is a list of the screening recommended for you and due dates:  Health Maintenance  Topic Date Due   DTaP/Tdap/Td vaccine (1 - Tdap) Never done   Pneumonia Vaccine (1 of 1 - PCV) 02/07/2023*   Flu Shot  02/15/2023   Mammogram  02/21/2023   DEXA scan (bone density measurement)  04/22/2023   Colon Cancer Screening  11/17/2023   Medicare Annual Wellness Visit  01/03/2024   HPV Vaccine  Aged Out   COVID-19 Vaccine  Discontinued   Hepatitis C Screening  Discontinued   Zoster (Shingles) Vaccine  Discontinued  *Topic was postponed. The date shown is not the original due date.     Preventive Care 35 Years and Older, Female Preventive care refers to lifestyle choices and visits with your health care provider that can promote health and wellness. What does preventive care include? A yearly physical exam. This is also called an annual well check. Dental exams once or twice a year. Routine eye exams. Ask your health care provider how often you should have your eyes checked. Personal lifestyle choices, including: Daily care of your teeth and gums. Regular physical activity. Eating a healthy diet. Avoiding tobacco and drug use. Limiting alcohol use. Practicing safe sex. Taking low-dose aspirin every day. Taking vitamin and mineral supplements as recommended by your health care provider. What happens during an annual well check? The services and screenings done by your health care provider during your annual well check will depend on your age, overall health, lifestyle risk factors, and family history of disease. Counseling  Your health care provider may ask you questions about your: Alcohol use. Tobacco use. Drug use. Emotional well-being. Home  and relationship well-being. Sexual activity. Eating habits. History of falls. Memory and ability to understand (cognition). Work and work Astronomer. Reproductive health. Screening  You may have the following tests or measurements: Height, weight, and BMI. Blood pressure. Lipid and cholesterol levels. These may be checked every 5 years, or more frequently if you are over 8 years old. Skin check. Lung cancer screening. You may have this screening every year starting at age 29 if you have a 30-pack-year history of smoking and currently smoke or have quit within the past 15 years. Fecal occult blood test (FOBT) of the stool. You may have this test every year starting at age 80. Flexible sigmoidoscopy or colonoscopy. You may have a sigmoidoscopy every 5 years or a colonoscopy every 10 years starting at age 109. Hepatitis C blood test. Hepatitis B blood test. Sexually transmitted disease (STD) testing. Diabetes screening. This is done by checking your blood sugar (glucose) after you have not eaten for a while (fasting). You may have this done every 1-3 years. Bone density scan. This is done to screen for osteoporosis. You may have this done starting at age 3. Mammogram. This may be done every 1-2 years. Talk to your health care provider about how often you should have regular mammograms. Talk with your health care provider about your test results, treatment options, and if necessary, the need for more tests. Vaccines  Your health care provider may recommend certain vaccines, such as: Influenza vaccine. This is recommended every year. Tetanus, diphtheria, and acellular pertussis (Tdap, Td) vaccine.  You may need a Td booster every 10 years. Zoster vaccine. You may need this after age 21. Pneumococcal 13-valent conjugate (PCV13) vaccine. One dose is recommended after age 20. Pneumococcal polysaccharide (PPSV23) vaccine. One dose is recommended after age 44. Talk to your health care provider  about which screenings and vaccines you need and how often you need them. This information is not intended to replace advice given to you by your health care provider. Make sure you discuss any questions you have with your health care provider. Document Released: 07/30/2015 Document Revised: 03/22/2016 Document Reviewed: 05/04/2015 Elsevier Interactive Patient Education  2017 [employer].  Fall Prevention in the Home Falls can cause injuries. They can happen to people of all ages. There are many things you can do to make your home safe and to help prevent falls. What can I do on the outside of my home? Regularly fix the edges of walkways and driveways and fix any cracks. Remove anything that might make you trip as you walk through a door, such as a raised step or threshold. Trim any bushes or trees on the path to your home. Use bright outdoor lighting. Clear any walking paths of anything that might make someone trip, such as rocks or tools. Regularly check to see if handrails are loose or broken. Make sure that both sides of any steps have handrails. Any raised decks and porches should have guardrails on the edges. Have any leaves, snow, or ice cleared regularly. Use sand or salt on walking paths during winter. Clean up any spills in your garage right away. This includes oil or grease spills. What can I do in the bathroom? Use night lights. Install grab bars by the toilet and in the tub and shower. Do not use towel bars as grab bars. Use non-skid mats or decals in the tub or shower. If you need to sit down in the shower, use a plastic, non-slip stool. Keep the floor dry. Clean up any water that spills on the floor as soon as it happens. Remove soap buildup in the tub or shower regularly. Attach bath mats securely with double-sided non-slip rug tape. Do not have throw rugs and other things on the floor that can make you trip. What can I do in the bedroom? Use night lights. Make sure  that you have a light by your bed that is easy to reach. Do not use any sheets or blankets that are too big for your bed. They should not hang down onto the floor. Have a firm chair that has side arms. You can use this for support while you get dressed. Do not have throw rugs and other things on the floor that can make you trip. What can I do in the kitchen? Clean up any spills right away. Avoid walking on wet floors. Keep items that you use a lot in easy-to-reach places. If you need to reach something above you, use a strong step stool that has a grab bar. Keep electrical cords out of the way. Do not use floor polish or wax that makes floors slippery. If you must use wax, use non-skid floor wax. Do not have throw rugs and other things on the floor that can make you trip. What can I do with my stairs? Do not leave any items on the stairs. Make sure that there are handrails on both sides of the stairs and use them. Fix handrails that are broken or loose. Make sure that handrails are as long as  the stairways. Check any carpeting to make sure that it is firmly attached to the stairs. Fix any carpet that is loose or worn. Avoid having throw rugs at the top or bottom of the stairs. If you do have throw rugs, attach them to the floor with carpet tape. Make sure that you have a light switch at the top of the stairs and the bottom of the stairs. If you do not have them, ask someone to add them for you. What else can I do to help prevent falls? Wear shoes that: Do not have high heels. Have rubber bottoms. Are comfortable and fit you well. Are closed at the toe. Do not wear sandals. If you use a stepladder: Make sure that it is fully opened. Do not climb a closed stepladder. Make sure that both sides of the stepladder are locked into place. Ask someone to hold it for you, if possible. Clearly mark and make sure that you can see: Any grab bars or handrails. First and last steps. Where the edge of  each step is. Use tools that help you move around (mobility aids) if they are needed. These include: Canes. Walkers. Scooters. Crutches. Turn on the lights when you go into a dark area. Replace any light bulbs as soon as they burn out. Set up your furniture so you have a clear path. Avoid moving your furniture around. If any of your floors are uneven, fix them. If there are any pets around you, be aware of where they are. Review your medicines with your doctor. Some medicines can make you feel dizzy. This can increase your chance of falling. Ask your doctor what other things that you can do to help prevent falls. This information is not intended to replace advice given to you by your health care provider. Make sure you discuss any questions you have with your health care provider. Document Released: 04/29/2009 Document Revised: 12/09/2015 Document Reviewed: 08/07/2014 Elsevier Interactive Patient Education  2017 [employer].

## 2023-01-03 NOTE — Progress Notes (Signed)
Subjective:   Brittany Lawson is a 67 y.o. female who presents for Medicare Annual (Subsequent) preventive examination.  Visit Complete: Virtual  I connected with  Eulis Canner on 01/03/23 by a audio enabled telemedicine application and verified that I am speaking with the correct person using two identifiers.  Patient Location: Home  Provider Location: Office/Clinic  I discussed the limitations of evaluation and management by telemedicine. The patient expressed understanding and agreed to proceed.  Cardiac Risk Factors include: advanced age (>78men, >60 women)     Objective:    There were no vitals filed for this visit. There is no height or weight on file to calculate BMI.     11/24/2020   10:03 AM 10/13/2020    3:00 PM 09/15/2020    6:55 AM 08/25/2020   10:01 AM  Advanced Directives  Does Patient Have a Medical Advance Directive? No No  No  Would patient like information on creating a medical advance directive? Yes (MAU/Ambulatory/Procedural Areas - Information given) No - Patient declined No - Patient declined No - Patient declined    Current Medications (verified) Outpatient Encounter Medications as of 01/03/2023  Medication Sig   ALPRAZolam (XANAX) 0.5 MG tablet 1 po qd prn anxiety   amLODipine (NORVASC) 5 MG tablet TAKE 1 TABLET BY MOUTH DAILY   fluticasone-salmeterol (ADVAIR) 250-50 MCG/ACT AEPB INHALE 1 PUFF INTO THE LUNGS IN THE MORNING AND AT BEDTIME.   ibuprofen (ADVIL) 800 MG tablet Take 1 tablet (800 mg total) by mouth every 8 (eight) hours as needed for moderate pain. For AFTER surgery only   losartan (COZAAR) 100 MG tablet TAKE 1 TABLET BY MOUTH AT BEDTIME.   meclizine (ANTIVERT) 25 MG tablet Take 25 mg by mouth 3 (three) times daily as needed for dizziness.   montelukast (SINGULAIR) 10 MG tablet TAKE 1 TABLET BY MOUTH DAILY.   pantoprazole (PROTONIX) 40 MG tablet TAKE 1 TABLET BY MOUTH TWICE DAILY.   pravastatin (PRAVACHOL) 20 MG tablet TAKE 1 TABLET BY  MOUTH DAILY.   PROAIR HFA 108 (90 Base) MCG/ACT inhaler INHALE 1 - 2 PUFFS EVERY 4 HOURS AS NEEDED   traZODone (DESYREL) 50 MG tablet TAKE 1 TABLET BY MOUTH AT BEDTIME.   venlafaxine XR (EFFEXOR-XR) 75 MG 24 hr capsule TAKE 1 CAPSULE BY MOUTH 2 TIMES DAILY.   No facility-administered encounter medications on file as of 01/03/2023.    Allergies (verified) Sulfa antibiotics   History: Past Medical History:  Diagnosis Date   Asthma    Blood clot in vein yrs ago   x 2 right leg   Colon polyps 2019   Constipation    Depression    GERD (gastroesophageal reflux disease)    History of kidney stones Apr 12, 2020   "passed on own"   Hypertension    Left ovarian cyst    Varicose veins of both lower extremities    Past Surgical History:  Procedure Laterality Date   AUGMENTATION MAMMAPLASTY Bilateral age 56's age 29's     each redone 2018 right side calcification removed   BREAST EXCISIONAL BIOPSY Right    APPROX 20 YEARS AGO   CHOLECYSTECTOMY  age 65   [facility]   colonscopy  10-2019 and 05-2020   polyp removed both times due may 2022   ROBOTIC ASSISTED TOTAL HYSTERECTOMY WITH BILATERAL SALPINGO OOPHERECTOMY N/A 09/15/2020   Procedure: XI ROBOTIC ASSISTED TOTAL HYSTERECTOMY WITH BILATERAL SALPINGO OOPHORECTOMY;  Surgeon: Adolphus Birchwood, MD;  Location: [facility];  Service:  Gynecology;  Laterality: N/A;   TUBAL LIGATION  age 59   UPPER GI ENDOSCOPY  05/2020   Family History  Problem Relation Age of Onset   Breast cancer Mother 80   Social History   Socioeconomic History   Marital status: Married    Spouse name: [place]   Number of children: 4   Years of education: 12   Highest education level: Not on file  Occupational History   Occupation: homemaker  Tobacco Use   Smoking status: Former    Packs/day: 1.50    Years: 20.00    Additional pack years: 0.00    Total pack years: 30.00    Types: Cigarettes    Quit date: 1996    Years since quitting: 28.4   Smokeless tobacco:  Never   Tobacco comments:    quit 25 yrs ago  Vaping Use   Vaping Use: Never used  Substance and Sexual Activity   Alcohol use: Not Currently   Drug use: Never   Sexual activity: Yes    Partners: Male  Other Topics Concern   Not on file  Social History Narrative   Not on file   Social Determinants of Health   Financial Resource Strain: Low Risk  (01/03/2023)   Overall Financial Resource Strain (CARDIA)    Difficulty of Paying Living Expenses: Not hard at all  Food Insecurity: No Food Insecurity (01/03/2023)   Hunger Vital Sign    Worried About Running Out of Food in the Last Year: Never true    Ran Out of Food in the Last Year: Never true  Transportation Needs: No Transportation Needs (01/03/2023)   PRAPARE - Administrator, Civil Service (Medical): No    Lack of Transportation (Non-Medical): No  Physical Activity: Insufficiently Active (01/03/2023)   Exercise Vital Sign    Days of Exercise per Week: 2 days    Minutes of Exercise per Session: 20 min  Stress: No Stress Concern Present (01/03/2023)   [employer] of Occupational Health - Occupational Stress Questionnaire    Feeling of Stress : Not at all  Social Connections: Not on file    Tobacco Counseling Counseling given: Not Answered Tobacco comments: quit 25 yrs ago   Clinical Intake:  Pre-visit preparation completed: Yes  Pain : No/denies pain     BMI - recorded: 30.95 Nutritional Status: BMI > 30  Obese Nutritional Risks: None Diabetes: No  How often do you need to have someone help you when you read instructions, pamphlets, or other written materials from your doctor or pharmacy?: 1 - Never  Interpreter Needed?: No      Activities of Daily Living    01/03/2023   10:05 AM  In your present state of health, do you have any difficulty performing the following activities:  Hearing? 1  Vision? 0  Difficulty concentrating or making decisions? 0  Walking or climbing stairs? 0  Dressing  or bathing? 0  Doing errands, shopping? 0  Preparing Food and eating ? N  Using the Toilet? N  In the past six months, have you accidently leaked urine? Y  Do you have problems with loss of bowel control? N  Managing your Medications? N  Managing your Finances? N  Housekeeping or managing your Housekeeping? N    Patient Care Team: Marianne Sofia, Cordelia Poche as PCP - [facility] (Ophthalmology) Debroah Baller, MD as Consulting Physician (Urology) Adolphus Birchwood, MD as Consulting Physician (Gynecologic Oncology)  Indicate any  recent Medical Services you may have received from other than [facility] providers in the past year (date may be approximate).     Assessment:   This is a routine wellness examination for Marlayna.  Hearing/Vision screen No results found.  Dietary issues and exercise activities discussed:     Goals Addressed   None    Depression Screen    01/03/2023   10:04 AM 08/16/2022    9:19 AM 02/06/2022    9:04 AM 11/24/2020   10:15 AM  PHQ 2/9 Scores  PHQ - 2 Score 0 0 3 0  PHQ- 9 Score  1 4     Fall Risk    01/03/2023   10:04 AM 02/06/2022    9:03 AM 11/24/2020   10:15 AM  Fall Risk   Falls in the past year? 0 0 0  Number falls in past yr: 0 0 0  Injury with Fall? 0 0 0  Risk for fall due to : No Fall Risks No Fall Risks No Fall Risks  Follow up Falls evaluation completed;Education provided Falls evaluation completed Falls evaluation completed;Falls prevention discussed    MEDICARE RISK AT HOME:  Medicare Risk at Home - 01/03/23 1004     Any stairs in or around the home? Yes    If so, are there any without handrails? No    Home free of loose throw rugs in walkways, pet beds, electrical cords, etc? Yes    Adequate lighting in your home to reduce risk of falls? Yes    Life alert? No    Use of a cane, walker or w/c? No    Grab bars in the bathroom? Yes    Shower chair or bench in shower? Yes    Elevated toilet seat or a  handicapped toilet? No             TIMED UP AND GO:  Was the test performed?  No    Cognitive Function:        01/03/2023   10:14 AM 11/24/2020   10:17 AM  6CIT Screen  What Year? 0 points 0 points  What month? 0 points 0 points  What time? 0 points 0 points  Count back from 20 0 points 0 points  Months in reverse 0 points 0 points  Repeat phrase 0 points 0 points  Total Score 0 points 0 points    Immunizations Immunization History  Administered Date(s) Administered   Influenza-Unspecified 04/16/2012, 04/16/2016, 05/17/2018, 05/05/2020, 04/16/2021   Moderna Covid-19 Vaccine Bivalent Booster 35yrs & up 04/15/2021   Moderna Sars-Covid-2 Vaccination 09/12/2019, 10/15/2019, 06/20/2020   Zoster Recombinat (Shingrix) 03/01/2018   Zoster, Live 07/16/2014    TDAP status: Due, Education has been provided regarding the importance of this vaccine. Advised may receive this vaccine at local pharmacy or Health Dept. Aware to provide a copy of the vaccination record if obtained from local pharmacy or Health Dept. Verbalized acceptance and understanding.  Flu Vaccine status: Declined, Education has been provided regarding the importance of this vaccine but patient still declined. Advised may receive this vaccine at local pharmacy or Health Dept. Aware to provide a copy of the vaccination record if obtained from local pharmacy or Health Dept. Verbalized acceptance and understanding.  Pneumococcal vaccine status: Declined,  Education has been provided regarding the importance of this vaccine but patient still declined. Advised may receive this vaccine at local pharmacy or Health Dept. Aware to provide a copy of the vaccination record if obtained from local  pharmacy or Health Dept. Verbalized acceptance and understanding.   Covid-19 vaccine status: Declined, Education has been provided regarding the importance of this vaccine but patient still declined. Advised may receive this vaccine at  local pharmacy or Health Dept.or vaccine clinic. Aware to provide a copy of the vaccination record if obtained from local pharmacy or Health Dept. Verbalized acceptance and understanding.  Qualifies for Shingles Vaccine? Yes   Zostavax completed Yes   Shingrix Completed?: No.    Education has been provided regarding the importance of this vaccine. Patient has been advised to call insurance company to determine out of pocket expense if they have not yet received this vaccine. Advised may also receive vaccine at local pharmacy or Health Dept. Verbalized acceptance and understanding.  Screening Tests Health Maintenance  Topic Date Due   DTaP/Tdap/Td (1 - Tdap) Never done   Medicare Annual Wellness (AWV)  11/24/2021   Pneumonia Vaccine 79+ Years old (1 of 1 - PCV) 02/07/2023 (Originally 11/11/2019)   INFLUENZA VACCINE  02/15/2023   MAMMOGRAM  02/21/2023   DEXA SCAN  04/22/2023   Colonoscopy  11/17/2023   HPV VACCINES  Aged Out   COVID-19 Vaccine  Discontinued   Hepatitis C Screening  Discontinued   Zoster Vaccines- Shingrix  Discontinued    Health Maintenance  Health Maintenance Due  Topic Date Due   DTaP/Tdap/Td (1 - Tdap) Never done   Medicare Annual Wellness (AWV)  11/24/2021    Colorectal cancer screening: Type of screening: Colonoscopy. Completed 11/2020. Repeat every 3 years  Mammogram status: Completed 02/2022. Repeat every year  Bone Density status: Completed 04/2021. Results reflect: Bone density results: OSTEOPENIA. Repeat every 2 years.  Lung Cancer Screening: (Low Dose CT Chest recommended if Age 79-80 years, 20 pack-year currently smoking OR have quit w/in 15years.) does not qualify.    Additional Screening:  Vision Screening: Recommended annual ophthalmology exams for early detection of glaucoma and other disorders of the eye. Is the patient up to date with their annual eye exam?  Yes  Who is the provider or what is the name of the office in which the patient attends  annual eye exams? [facility]  Dental Screening: Recommended annual dental exams for proper oral hygiene   Community Resource Referral / Chronic Care Management: CRR required this visit?  No   CCM required this visit?  No     Plan:    1- Vaccines declined 2- Mammogram due in August, DEXA due in October 3- Colonoscopy due 11/2023 4- Counseling was provided today regarding the following topics: healthy eating habits, home safety, vitamin and mineral supplementation (calcium and Vit D), regular exercise, breast self-exam, tobacco avoidance, limitation of alcohol intake, use of seat belts, firearm safety, and fall prevention. 5- Aim for 30 minutes of exercise or brisk walking, 6-8 glasses of water, and 5 servings of fruits and vegetables each day.   Annual recommendations include: influenza vaccine, dental cleanings, and eye exams.   I have personally reviewed and noted the following in the patient's chart:   Medical and social history Use of alcohol, tobacco or illicit drugs  Current medications and supplements including opioid prescriptions. Patient is not currently taking opioid prescriptions. Functional ability and status Nutritional status Physical activity Advanced directives List of other physicians Hospitalizations, surgeries, and ER visits in previous 12 months Vitals Screenings to include cognitive, depression, and falls Referrals and appointments  In addition, I have reviewed and discussed with patient certain preventive protocols, quality metrics, and best practice  recommendations. A written personalized care plan for preventive services as well as general preventive health recommendations were provided to patient.     Jacklynn Bue, LPN   [id number]/61/0960   After Visit Summary: (MyChart) Due to this being a telephonic visit, the after visit summary with patients personalized plan was offered to patient via MyChart

## 2023-01-04 ENCOUNTER — Other Ambulatory Visit: Payer: Self-pay | Admitting: Physician Assistant

## 2023-01-04 DIAGNOSIS — I10 Essential (primary) hypertension: Secondary | ICD-10-CM

## 2023-01-04 DIAGNOSIS — F419 Anxiety disorder, unspecified: Secondary | ICD-10-CM

## 2023-01-04 DIAGNOSIS — K219 Gastro-esophageal reflux disease without esophagitis: Secondary | ICD-10-CM

## 2023-02-21 ENCOUNTER — Ambulatory Visit: Payer: Medicare Other | Admitting: Physician Assistant

## 2023-02-21 ENCOUNTER — Encounter: Payer: Self-pay | Admitting: Physician Assistant

## 2023-02-21 ENCOUNTER — Telehealth: Payer: Self-pay

## 2023-02-21 ENCOUNTER — Other Ambulatory Visit: Payer: Self-pay | Admitting: Physician Assistant

## 2023-02-21 VITALS — BP 136/70 | HR 65 | Temp 97.3°F | Ht 65.0 in | Wt 184.6 lb

## 2023-02-21 DIAGNOSIS — E782 Mixed hyperlipidemia: Secondary | ICD-10-CM

## 2023-02-21 DIAGNOSIS — R7303 Prediabetes: Secondary | ICD-10-CM

## 2023-02-21 DIAGNOSIS — Z6830 Body mass index (BMI) 30.0-30.9, adult: Secondary | ICD-10-CM

## 2023-02-21 DIAGNOSIS — Z1231 Encounter for screening mammogram for malignant neoplasm of breast: Secondary | ICD-10-CM

## 2023-02-21 DIAGNOSIS — I10 Essential (primary) hypertension: Secondary | ICD-10-CM

## 2023-02-21 DIAGNOSIS — K219 Gastro-esophageal reflux disease without esophagitis: Secondary | ICD-10-CM

## 2023-02-21 DIAGNOSIS — E559 Vitamin D deficiency, unspecified: Secondary | ICD-10-CM | POA: Diagnosis not present

## 2023-02-21 DIAGNOSIS — H918X9 Other specified hearing loss, unspecified ear: Secondary | ICD-10-CM

## 2023-02-21 MED ORDER — PHENTERMINE-TOPIRAMATE 3.75-23 MG PO CP24
ORAL_CAPSULE | ORAL | 0 refills | Status: DC
Start: 2023-02-21 — End: 2023-02-28

## 2023-02-21 NOTE — Progress Notes (Signed)
Subjective:  Patient ID: Brittany Lawson, female    DOB: [birth year]  Age: 67 y.o. MRN: [id number]  Chief Complaint  Patient presents with   Medical Management of Chronic Issues    HPI Pt presents for follow up of hypertension.  The patient is tolerating the medication well without side effects. Compliance with treatment has been good; including taking medication as directed , maintains a healthy diet and regular exercise regimen , and following up as directed.she is taking norvasc 5mg  qd and losartan 100mg  qd Denies chest pain/sob  Pt with anxiety - stable on current medications of xanax prn and effexor 75mg  - she also takes trazodone 50mg  at bedtime for insomina  Pt with GERD - stable on protonix 40mg  qd  Mixed hyperlipidemia  Pt presents with hyperlipidemia. Compliance with treatment has been good -The patient is compliant with medications, maintains a low cholesterol diet , follows up as directed , and maintains an exercise regimen . The patient denies experiencing any hypercholesterolemia related symptoms. Taking pravachol 20mg  qd  Pt with asthma - symptoms stable on proair and advair  Pt with hearing impairment and wears hearing aids.  She would like referral to new ENT for management  Pt would like to try medication for weight loss.  Is currently at 30BMI - trying to watch diet  Pt would like to schedule mammogram       02/21/2023    8:53 AM 01/03/2023   10:04 AM 08/16/2022    9:19 AM 02/06/2022    9:04 AM 11/24/2020   10:15 AM  Depression screen PHQ 2/9  Decreased Interest 0 0 0 3 0  Down, Depressed, Hopeless 0 0 0 0 0  PHQ - 2 Score 0 0 0 3 0  Altered sleeping 0  0 0   Tired, decreased energy 0  0 0   Change in appetite 0  1 0   Feeling bad or failure about yourself  0  0 1   Trouble concentrating 0  0 0   Moving slowly or fidgety/restless 0  0 0   Suicidal thoughts 0  0 0   PHQ-9 Score 0  1 4   Difficult doing work/chores Not difficult at all  Not difficult at  all Not difficult at all         10/13/2020    3:00 PM 11/24/2020   10:15 AM 02/06/2022    9:03 AM 01/03/2023   10:04 AM 02/21/2023    8:53 AM  Fall Risk  Falls in the past year?  0 0 0 0  Was there an injury with Fall?  0 0 0 0  Fall Risk Category Calculator  0 0 0 0  Fall Risk Category (Retired)  Low Low    (RETIRED) Patient Fall Risk Level Low fall risk  Low fall risk    Patient at Risk for Falls Due to  No Fall Risks No Fall Risks No Fall Risks No Fall Risks  Fall risk Follow up  Falls evaluation completed;Falls prevention discussed Falls evaluation completed Falls evaluation completed;Education provided Falls evaluation completed     ROS CONSTITUTIONAL: Negative for chills, fatigue, fever, unintentional weight gain and unintentional weight loss.  E/N/T: Negative for ear pain, nasal congestion and sore throat.  CARDIOVASCULAR: Negative for chest pain, dizziness, palpitations and pedal edema.  RESPIRATORY: Negative for recent cough and dyspnea.  GASTROINTESTINAL: Negative for abdominal pain, acid reflux symptoms, constipation, diarrhea, nausea and vomiting.  MSK: Negative for arthralgias and myalgias.  INTEGUMENTARY: Negative for rash.  NEUROLOGICAL: Negative for dizziness and headaches.  PSYCHIATRIC: Negative for sleep disturbance and to question depression screen.  Negative for depression, negative for anhedonia.    Current Outpatient Medications:    ALPRAZolam (XANAX) 0.5 MG tablet, 1 po qd prn anxiety, Disp: 30 tablet, Rfl: 1   amLODipine (NORVASC) 5 MG tablet, TAKE 1 TABLET BY MOUTH DAILY, Disp: 90 tablet, Rfl: 0   fluticasone-salmeterol (ADVAIR) 250-50 MCG/ACT AEPB, INHALE 1 PUFF INTO THE LUNGS IN THE MORNING AND AT BEDTIME., Disp: 60 each, Rfl: 3   ibuprofen (ADVIL) 800 MG tablet, Take 1 tablet (800 mg total) by mouth every 8 (eight) hours as needed for moderate pain. For AFTER surgery only, Disp: 30 tablet, Rfl: 0   losartan (COZAAR) 100 MG tablet, TAKE 1 TABLET BY MOUTH AT  BEDTIME., Disp: 90 tablet, Rfl: 0   meclizine (ANTIVERT) 25 MG tablet, Take 25 mg by mouth 3 (three) times daily as needed for dizziness., Disp: , Rfl:    montelukast (SINGULAIR) 10 MG tablet, TAKE 1 TABLET BY MOUTH DAILY., Disp: 90 tablet, Rfl: 0   pantoprazole (PROTONIX) 40 MG tablet, TAKE 1 TABLET BY MOUTH TWICE DAILY., Disp: 180 tablet, Rfl: 0   Phentermine-Topiramate 3.75-23 MG CP24, 1 po qd, Disp: 30 capsule, Rfl: 0   pravastatin (PRAVACHOL) 20 MG tablet, TAKE 1 TABLET BY MOUTH DAILY., Disp: 90 tablet, Rfl: 0   PROAIR HFA 108 (90 Base) MCG/ACT inhaler, INHALE 1 - 2 PUFFS EVERY 4 HOURS AS NEEDED, Disp: 8.5 g, Rfl: 1   traZODone (DESYREL) 50 MG tablet, TAKE 1 TABLET BY MOUTH AT BEDTIME., Disp: 90 tablet, Rfl: 0   triamcinolone ointment (KENALOG) 0.1 %, Apply topically., Disp: , Rfl:    venlafaxine XR (EFFEXOR-XR) 75 MG 24 hr capsule, TAKE 1 CAPSULE BY MOUTH 2 TIMES DAILY., Disp: 180 capsule, Rfl: 0  Past Medical History:  Diagnosis Date   Asthma    Blood clot in vein yrs ago   x 2 right leg   Colon polyps 2019   Constipation    Depression    GERD (gastroesophageal reflux disease)    History of kidney stones 04-26-20   "passed on own"   Hypertension    Left ovarian cyst    Varicose veins of both lower extremities    Objective:  PHYSICAL EXAM:   BP 136/70 (BP Location: Left Arm, Patient Position: Sitting, Cuff Size: Large)   Pulse 65   Temp (!) 97.3 F (36.3 C) (Temporal)   Ht 5\' 5"  (1.651 m)   Wt 184 lb 9.6 oz (83.7 kg)   SpO2 97%   BMI 30.72 kg/m    GEN: Well nourished, well developed, in no acute distress  Cardiac: RRR; no murmurs, rubs, or gallops,no edema -  Respiratory:  normal respiratory rate and pattern with no distress - normal breath sounds with no rales, rhonchi, wheezes or rubs  MS: no deformity or atrophy  Skin: warm and dry, no rash  Neuro:  Alert and Oriented x 3, Strength and sensation are intact - CN II-Xii grossly intact Psych: euthymic mood,  appropriate affect and demeanor  Assessment & Plan:    Essential hypertension -     CBC with Differential/Platelet -     Comprehensive metabolic panel -     TSH Continue med Mixed hyperlipidemia -     Comprehensive metabolic panel -     Lipid panel Continue med - watch diet Vitamin D insufficiency -  VITAMIN D 25 Hydroxy (Vit-D Deficiency, Fractures)  Gastroesophageal reflux disease without esophagitis Continue protonix 40mg  qd Prediabetes -     Hemoglobin A1c Watch diet Body mass index (BMI) 30.0-30.9, adult -     Phentermine-Topiramate; 1 po qd  Dispense: 30 capsule; Refill: 0 Watch diet Other specified forms of hearing loss, unspecified laterality -     Ambulatory referral to ENT  Encounter for screening mammogram for breast cancer -     3D Screening Mammogram, Left and Right; Future     Follow-up: Return in about 3 months (around 05/24/2023) for chronic fasting follow-up.  An After Visit Summary was printed and given to the patient.  Jettie Pagan [facility] [phone]

## 2023-02-21 NOTE — Telephone Encounter (Signed)
[facility] pharmacy called and stated that they don't have Phentermine-Topiramate, stated only special pharmacy can carrie this medication, wanted to know if you could send in the medications separately it would be cheaper for the patient. Please advise

## 2023-02-21 NOTE — Telephone Encounter (Signed)
Notify all pharmacies are capable of carrying qsymia --- they can choose whether or not they want to order and have in supply Cannot separate out the two meds (cannot get phentermine in 3.75mg  starting dose that is in qsymia) If her insurance covers weight loss medications we can try wegovy if she is interested

## 2023-02-21 NOTE — Telephone Encounter (Signed)
Called patient made her aware, and patient stated she was gone call me back, she needed to call [facility].  Spoke with Morrie Sheldon from [employer] and she stated that the patient could not afford the medication sent in and that is why they was asking for it to be separated.  Per Marianne Sofia, PA: She don't recommend medication be separated due to patient hypertension she will not be able to take the lowest dose of phentermine that would be able for her to get, and we could send the patient to see a dietitian and patient can work on her diet but she does not recommend another weight loss medication at this time.  Made Morrie Sheldon aware of provider recommendation and she stated she will let the patient aware.

## 2023-02-28 ENCOUNTER — Other Ambulatory Visit: Payer: Self-pay

## 2023-02-28 DIAGNOSIS — Z6830 Body mass index (BMI) 30.0-30.9, adult: Secondary | ICD-10-CM

## 2023-02-28 MED ORDER — PHENTERMINE-TOPIRAMATE 3.75-23 MG PO CP24
ORAL_CAPSULE | ORAL | 0 refills | Status: DC
Start: 2023-02-28 — End: 2023-03-26

## 2023-02-28 NOTE — Telephone Encounter (Signed)
Patient called and stated that she has an online pharmacy where the qsymia will be cheaper for her and would like it sent to them [facility]. Also stated that they may send in a request for this medication.

## 2023-03-22 ENCOUNTER — Telehealth: Payer: Self-pay

## 2023-03-22 NOTE — Telephone Encounter (Signed)
Patient called stated that she has a rash on her back, both arms, and stomach. Stated she has had this all summer and she went to dermatologist and they gave her cream and now it is very itchy. Think she may need a kenalog shot.  Appointment made for tomorrow at 03/23/23.

## 2023-03-23 ENCOUNTER — Encounter: Payer: Self-pay | Admitting: Physician Assistant

## 2023-03-23 ENCOUNTER — Ambulatory Visit (INDEPENDENT_AMBULATORY_CARE_PROVIDER_SITE_OTHER): Payer: Medicare Other | Admitting: Physician Assistant

## 2023-03-23 VITALS — BP 122/64 | HR 70 | Temp 97.2°F | Resp 16 | Ht 65.0 in | Wt 180.0 lb

## 2023-03-23 DIAGNOSIS — L309 Dermatitis, unspecified: Secondary | ICD-10-CM | POA: Insufficient documentation

## 2023-03-23 HISTORY — DX: Dermatitis, unspecified: L30.9

## 2023-03-23 MED ORDER — TRIAMCINOLONE ACETONIDE 40 MG/ML IJ SUSP
80.0000 mg | Freq: Once | INTRAMUSCULAR | Status: AC
Start: 2023-03-23 — End: 2023-03-23
  Administered 2023-03-23: 80 mg via INTRAMUSCULAR

## 2023-03-23 NOTE — Progress Notes (Signed)
Acute Office Visit  Subjective:    Patient ID: Brittany Lawson, female    DOB: 04-27-1956, 67 y.o.   MRN: [id number]  Chief Complaint  Patient presents with   Rash    HPI: Patient is in today for complaints of rash on abdomen, between two fingers on left hand and then itching all over - she states she has seen dermatologist and was given steroid cream but that hasn't really helped  She says that she has this problem frequently - usually kenalog injection helps She takes singulair but no antihistamines   Current Outpatient Medications:    ALPRAZolam (XANAX) 0.5 MG tablet, 1 po qd prn anxiety, Disp: 30 tablet, Rfl: 1   amLODipine (NORVASC) 5 MG tablet, TAKE 1 TABLET BY MOUTH DAILY, Disp: 90 tablet, Rfl: 0   fluticasone-salmeterol (ADVAIR) 250-50 MCG/ACT AEPB, INHALE 1 PUFF INTO THE LUNGS IN THE MORNING AND AT BEDTIME., Disp: 60 each, Rfl: 3   ibuprofen (ADVIL) 800 MG tablet, Take 1 tablet (800 mg total) by mouth every 8 (eight) hours as needed for moderate pain. For AFTER surgery only, Disp: 30 tablet, Rfl: 0   losartan (COZAAR) 100 MG tablet, TAKE 1 TABLET BY MOUTH AT BEDTIME., Disp: 90 tablet, Rfl: 0   meclizine (ANTIVERT) 25 MG tablet, Take 25 mg by mouth 3 (three) times daily as needed for dizziness., Disp: , Rfl:    montelukast (SINGULAIR) 10 MG tablet, TAKE 1 TABLET BY MOUTH DAILY., Disp: 90 tablet, Rfl: 0   pantoprazole (PROTONIX) 40 MG tablet, TAKE 1 TABLET BY MOUTH TWICE DAILY., Disp: 180 tablet, Rfl: 0   Phentermine-Topiramate 3.75-23 MG CP24, 1 po qd, Disp: 30 capsule, Rfl: 0   pravastatin (PRAVACHOL) 20 MG tablet, TAKE 1 TABLET BY MOUTH DAILY., Disp: 90 tablet, Rfl: 0   PROAIR HFA 108 (90 Base) MCG/ACT inhaler, INHALE 1 - 2 PUFFS EVERY 4 HOURS AS NEEDED, Disp: 8.5 g, Rfl: 1   traZODone (DESYREL) 50 MG tablet, TAKE 1 TABLET BY MOUTH AT BEDTIME., Disp: 90 tablet, Rfl: 0   triamcinolone ointment (KENALOG) 0.1 %, Apply topically., Disp: , Rfl:    venlafaxine XR (EFFEXOR-XR) 75  MG 24 hr capsule, TAKE 1 CAPSULE BY MOUTH 2 TIMES DAILY., Disp: 180 capsule, Rfl: 0  Current Facility-Administered Medications:    triamcinolone acetonide (KENALOG-40) injection 80 mg, 80 mg, Intramuscular, Once,   Allergies  Allergen Reactions   Sulfa Antibiotics Nausea Only and Other (See Comments)    Childhood reaction    ROS CONSTITUTIONAL: Negative for chills, fatigue, fever, CARDIOVASCULAR: Negative for chest pain, dizziness, palpitations   RESPIRATORY: Negative for recent cough and dyspnea.    INTEGUMENTARY: see HPI      Objective:    PHYSICAL EXAM:   BP 122/64   Pulse 70   Temp (!) 97.2 F (36.2 C) (Temporal)   Resp 16   Ht 5\' 5"  (1.651 m)   Wt 180 lb (81.6 kg)   SpO2 100%   BMI 29.95 kg/m    GEN: Well nourished, well developed, in no acute distress  Cardiac: RRR; no murmurs, rubs,  Respiratory:  normal respiratory rate and pattern with no distress - normal breath sounds with no rales, rhonchi, wheezes or rubs  Skin: eczematous lesions noted on arms, hand and abdomen      Assessment & Plan:    Dermatitis -     Triamcinolone Acetonide 80mg  given IM Recommend zyrtec 10mg  qd     Follow-up: Return if symptoms worsen or fail  to improve.  An After Visit Summary was printed and given to the patient.  Jettie Pagan [facility] [phone]

## 2023-03-26 ENCOUNTER — Other Ambulatory Visit: Payer: Self-pay

## 2023-03-26 ENCOUNTER — Other Ambulatory Visit: Payer: Self-pay | Admitting: Physician Assistant

## 2023-03-26 DIAGNOSIS — Z6830 Body mass index (BMI) 30.0-30.9, adult: Secondary | ICD-10-CM

## 2023-03-26 MED ORDER — PHENTERMINE-TOPIRAMATE 7.5-46 MG PO CP24
ORAL_CAPSULE | ORAL | 0 refills | Status: DC
Start: 2023-03-26 — End: 2023-04-09

## 2023-03-26 NOTE — Telephone Encounter (Signed)
Rx sent 

## 2023-03-26 NOTE — Telephone Encounter (Signed)
Please call pt and see if she is tolerating qsymia and ready to go up to next dose

## 2023-03-26 NOTE — Telephone Encounter (Signed)
Patient called and stated she is tolerating medication fine and is ready to go to next dose. Patient stated that pharmacy can be done in 3 months supply if she wanted but she told them that she would leave it up to you to decide.

## 2023-03-27 ENCOUNTER — Other Ambulatory Visit: Payer: Self-pay

## 2023-03-29 ENCOUNTER — Other Ambulatory Visit: Payer: Self-pay | Admitting: Physician Assistant

## 2023-03-29 DIAGNOSIS — K219 Gastro-esophageal reflux disease without esophagitis: Secondary | ICD-10-CM

## 2023-03-29 DIAGNOSIS — F419 Anxiety disorder, unspecified: Secondary | ICD-10-CM

## 2023-03-29 DIAGNOSIS — I10 Essential (primary) hypertension: Secondary | ICD-10-CM

## 2023-04-04 ENCOUNTER — Ambulatory Visit
Admission: RE | Admit: 2023-04-04 | Discharge: 2023-04-04 | Disposition: A | Discharge status: Home / Self Care | Payer: Medicare Other | Source: Ambulatory Visit | Attending: Physician Assistant | Admitting: Physician Assistant

## 2023-04-04 DIAGNOSIS — Z1231 Encounter for screening mammogram for malignant neoplasm of breast: Secondary | ICD-10-CM

## 2023-04-05 ENCOUNTER — Other Ambulatory Visit: Payer: Self-pay

## 2023-04-05 DIAGNOSIS — Z6830 Body mass index (BMI) 30.0-30.9, adult: Secondary | ICD-10-CM

## 2023-04-09 ENCOUNTER — Other Ambulatory Visit: Payer: Self-pay

## 2023-04-09 DIAGNOSIS — Z6830 Body mass index (BMI) 30.0-30.9, adult: Secondary | ICD-10-CM

## 2023-04-09 MED ORDER — PHENTERMINE-TOPIRAMATE 7.5-46 MG PO CP24
ORAL_CAPSULE | ORAL | 0 refills | Status: DC
Start: 2023-04-09 — End: 2023-04-12

## 2023-04-09 NOTE — Telephone Encounter (Signed)
Patient called and stated that [facility] pharmacy still has not received her RX for [facility]. She would like it sent to [facility] pharmacy in [place].

## 2023-04-12 ENCOUNTER — Other Ambulatory Visit: Payer: Self-pay

## 2023-04-12 DIAGNOSIS — Z6830 Body mass index (BMI) 30.0-30.9, adult: Secondary | ICD-10-CM

## 2023-04-12 MED ORDER — PHENTERMINE-TOPIRAMATE 7.5-46 MG PO CP24
ORAL_CAPSULE | ORAL | 0 refills | Status: DC
Start: 2023-04-12 — End: 2023-07-16

## 2023-04-12 NOTE — Telephone Encounter (Signed)
Patient called stated [facility] will not take goodrx card so she need rx sent to [facility] pharmacy.

## 2023-04-20 ENCOUNTER — Encounter: Payer: Self-pay | Admitting: Physician Assistant

## 2023-04-20 ENCOUNTER — Ambulatory Visit (INDEPENDENT_AMBULATORY_CARE_PROVIDER_SITE_OTHER): Payer: Medicare Other | Admitting: Physician Assistant

## 2023-04-20 VITALS — BP 120/70 | HR 66 | Temp 97.2°F | Ht 65.0 in | Wt 178.4 lb

## 2023-04-20 DIAGNOSIS — H0100B Unspecified blepharitis left eye, upper and lower eyelids: Secondary | ICD-10-CM | POA: Diagnosis not present

## 2023-04-20 MED ORDER — CEPHALEXIN 500 MG PO CAPS
500.0000 mg | ORAL_CAPSULE | Freq: Two times a day (BID) | ORAL | 0 refills | Status: DC
Start: 2023-04-20 — End: 2023-05-25

## 2023-04-20 MED ORDER — PREDNISONE 20 MG PO TABS
ORAL_TABLET | ORAL | 0 refills | Status: DC
Start: 2023-04-20 — End: 2023-05-25

## 2023-04-20 NOTE — Progress Notes (Signed)
Acute Office Visit  Subjective:    Patient ID: Brittany Lawson, female    DOB: [birth year], 67 y.o.   MRN: [id number]  Chief Complaint  Patient presents with   Eye Problem    Left eye swollen    HPI: Patient is in today for complaints of left eyelid red, inflamed and itching - cannot recall any injury , bug bite, etc Denies fever, congestion No vision changes   Current Outpatient Medications:    ALPRAZolam (XANAX) 0.5 MG tablet, 1 po qd prn anxiety, Disp: 30 tablet, Rfl: 1   amLODipine (NORVASC) 5 MG tablet, TAKE 1 TABLET BY MOUTH DAILY, Disp: 90 tablet, Rfl: 0   cephALEXin (KEFLEX) 500 MG capsule, Take 1 capsule (500 mg total) by mouth 2 (two) times daily., Disp: 20 capsule, Rfl: 0   fluticasone-salmeterol (ADVAIR) 250-50 MCG/ACT AEPB, INHALE 1 PUFF INTO THE LUNGS IN THE MORNING AND AT BEDTIME., Disp: 60 each, Rfl: 3   ibuprofen (ADVIL) 800 MG tablet, Take 1 tablet (800 mg total) by mouth every 8 (eight) hours as needed for moderate pain. For AFTER surgery only, Disp: 30 tablet, Rfl: 0   losartan (COZAAR) 100 MG tablet, TAKE 1 TABLET BY MOUTH AT BEDTIME., Disp: 90 tablet, Rfl: 0   meclizine (ANTIVERT) 25 MG tablet, Take 25 mg by mouth 3 (three) times daily as needed for dizziness., Disp: , Rfl:    montelukast (SINGULAIR) 10 MG tablet, TAKE 1 TABLET BY MOUTH DAILY., Disp: 90 tablet, Rfl: 0   pantoprazole (PROTONIX) 40 MG tablet, TAKE 1 TABLET BY MOUTH TWICE DAILY, Disp: 180 tablet, Rfl: 0   Phentermine-Topiramate 7.5-46 MG CP24, 1 po qd, Disp: 90 capsule, Rfl: 0   pravastatin (PRAVACHOL) 20 MG tablet, TAKE 1 TABLET BY MOUTH DAILY., Disp: 90 tablet, Rfl: 0   predniSONE (DELTASONE) 20 MG tablet, 1 po tid for 3 days then 1 po bid for 3 days then 1 po qd for 3 days, Disp: 18 tablet, Rfl: 0   PROAIR HFA 108 (90 Base) MCG/ACT inhaler, INHALE 1 - 2 PUFFS EVERY 4 HOURS AS NEEDED, Disp: 8.5 g, Rfl: 1   traZODone (DESYREL) 50 MG tablet, TAKE 1 TABLET BY MOUTH AT BEDTIME, Disp: 90 tablet, Rfl:  0   triamcinolone ointment (KENALOG) 0.1 %, Apply topically., Disp: , Rfl:    venlafaxine XR (EFFEXOR-XR) 75 MG 24 hr capsule, TAKE 1 CAPSULE BY MOUTH TWO TIMES DAILY., Disp: 180 capsule, Rfl: 0  Allergies  Allergen Reactions   Sulfa Antibiotics Nausea Only and Other (See Comments)    Childhood reaction    ROS CONSTITUTIONAL: Negative for chills, fatigue, fever, E/N/T: Negative for ear pain, nasal congestion and sore throat.  Eyes - see HPI CARDIOVASCULAR: Negative for chest pain,  Lungs - normal      Objective:    PHYSICAL EXAM:   BP 120/70 (BP Location: Left Arm, Patient Position: Sitting, Cuff Size: Large)   Pulse 66   Temp (!) 97.2 F (36.2 C) (Temporal)   Ht 5\' 5"  (1.651 m)   Wt 178 lb 6.4 oz (80.9 kg)   SpO2 95%   BMI 29.69 kg/m    GEN: Well nourished, well developed, in no acute distress  EYEs - conjunctiva both eyes normal - left eyelid red and inflamed - no cellulitis noted  Cardiac: RRR; no murmurs,  Respiratory:  normal respiratory rate and pattern with no distress - normal breath sounds with no rales, rhonchi, wheezes or rubs      Assessment &  Plan:    Blepharitis of both upper and lower eyelid of left eye, unspecified type -     Cephalexin; Take 1 capsule (500 mg total) by mouth 2 (two) times daily.  Dispense: 20 capsule; Refill: 0 -     predniSONE; 1 po tid for 3 days then 1 po bid for 3 days then 1 po qd for 3 days  Dispense: 18 tablet; Refill: 0     Follow-up: Return if symptoms worsen or fail to improve.  An After Visit Summary was printed and given to the patient.  Jettie Pagan [facility] [phone]

## 2023-05-04 DIAGNOSIS — H6123 Impacted cerumen, bilateral: Secondary | ICD-10-CM | POA: Insufficient documentation

## 2023-05-04 HISTORY — DX: Impacted cerumen, bilateral: H61.23

## 2023-05-08 ENCOUNTER — Other Ambulatory Visit: Payer: Self-pay | Admitting: Physician Assistant

## 2023-05-25 ENCOUNTER — Encounter: Payer: Self-pay | Admitting: Physician Assistant

## 2023-05-25 ENCOUNTER — Ambulatory Visit (INDEPENDENT_AMBULATORY_CARE_PROVIDER_SITE_OTHER): Payer: Medicare Other | Admitting: Physician Assistant

## 2023-05-25 VITALS — BP 134/78 | HR 67 | Temp 97.3°F | Ht 65.0 in | Wt 178.0 lb

## 2023-05-25 DIAGNOSIS — Z6830 Body mass index (BMI) 30.0-30.9, adult: Secondary | ICD-10-CM | POA: Insufficient documentation

## 2023-05-25 DIAGNOSIS — I10 Essential (primary) hypertension: Secondary | ICD-10-CM

## 2023-05-25 HISTORY — DX: Body mass index (BMI) 30.0-30.9, adult: Z68.30

## 2023-05-25 NOTE — Progress Notes (Signed)
Subjective:  Patient ID: Brittany Lawson, female    DOB: Feb 08, 1956  Age: 67 y.o. MRN: [id number]  Chief Complaint  Patient presents with   Medical Management of Chronic Issues    HPI Pt presents for follow up of hypertension.  The patient is tolerating the medication well without side effects. Compliance with treatment has been good; including taking medication as directed , maintains a healthy diet and regular exercise regimen , and following up as directed.she is taking norvasc 5mg  qd and losartan 100mg  qd Denies chest pain/sob   Pt is taking qsymia but at a lower dose - would like to increase her dose - she is currently on 7.5-46 -- has 2 bottles of those left - she will slowly titrate up       05/25/2023    9:30 AM 02/21/2023    8:53 AM 01/03/2023   10:04 AM 08/16/2022    9:19 AM 02/06/2022    9:04 AM  Depression screen PHQ 2/9  Decreased Interest 1 0 0 0 3  Down, Depressed, Hopeless 1 0 0 0 0  PHQ - 2 Score 2 0 0 0 3  Altered sleeping 1 0  0 0  Tired, decreased energy 1 0  0 0  Change in appetite 1 0  1 0  Feeling bad or failure about yourself  1 0  0 1  Trouble concentrating 0 0  0 0  Moving slowly or fidgety/restless 0 0  0 0  Suicidal thoughts 0 0  0 0  PHQ-9 Score 6 0  1 4  Difficult doing work/chores Not difficult at all Not difficult at all  Not difficult at all Not difficult at all        11/24/2020   10:15 AM 02/06/2022    9:03 AM 01/03/2023   10:04 AM 02/21/2023    8:53 AM 05/25/2023    9:30 AM  Fall Risk  Falls in the past year? 0 0 0 0 0  Was there an injury with Fall? 0 0 0 0 0  Fall Risk Category Calculator 0 0 0 0 0  Fall Risk Category (Retired) Low Low     (RETIRED) Patient Fall Risk Level  Low fall risk     Patient at Risk for Falls Due to No Fall Risks No Fall Risks No Fall Risks No Fall Risks No Fall Risks  Fall risk Follow up Falls evaluation completed;Falls prevention discussed Falls evaluation completed Falls evaluation completed;Education  provided Falls evaluation completed Falls evaluation completed     CONSTITUTIONAL: Negative for chills, fatigue, fever, unintentional weight gain and unintentional weight loss.  CARDIOVASCULAR: Negative for chest pain, dizziness, palpitations and pedal edema.  RESPIRATORY: Negative for recent cough and dyspnea.  GASTROINTESTINAL: Negative for abdominal pain, acid reflux symptoms, constipation, diarrhea, nausea and vomiting.  MSK: Negative for arthralgias and myalgias.  INTEGUMENTARY: Negative for rash.  PSYCHIATRIC: Negative for sleep disturbance and to question depression screen.  Negative for depression, negative for anhedonia.       Current Outpatient Medications:    albuterol (VENTOLIN HFA) 108 (90 Base) MCG/ACT inhaler, INHALE 1 - 2 PUFFS EVERY 4 HOURS AS NEEDED, Disp: 8.5 g, Rfl: 0   ALPRAZolam (XANAX) 0.5 MG tablet, 1 po qd prn anxiety, Disp: 30 tablet, Rfl: 1   amLODipine (NORVASC) 5 MG tablet, TAKE 1 TABLET BY MOUTH DAILY, Disp: 90 tablet, Rfl: 0   fluticasone-salmeterol (ADVAIR) 250-50 MCG/ACT AEPB, INHALE 1 PUFF INTO THE LUNGS IN THE MORNING AND  AT BEDTIME., Disp: 60 each, Rfl: 3   ibuprofen (ADVIL) 800 MG tablet, Take 1 tablet (800 mg total) by mouth every 8 (eight) hours as needed for moderate pain. For AFTER surgery only, Disp: 30 tablet, Rfl: 0   losartan (COZAAR) 100 MG tablet, TAKE 1 TABLET BY MOUTH AT BEDTIME., Disp: 90 tablet, Rfl: 0   meclizine (ANTIVERT) 25 MG tablet, Take 25 mg by mouth 3 (three) times daily as needed for dizziness., Disp: , Rfl:    montelukast (SINGULAIR) 10 MG tablet, TAKE 1 TABLET BY MOUTH DAILY., Disp: 90 tablet, Rfl: 0   pantoprazole (PROTONIX) 40 MG tablet, TAKE 1 TABLET BY MOUTH TWICE DAILY, Disp: 180 tablet, Rfl: 0   Phentermine-Topiramate 7.5-46 MG CP24, 1 po qd, Disp: 90 capsule, Rfl: 0   pravastatin (PRAVACHOL) 20 MG tablet, TAKE 1 TABLET BY MOUTH DAILY., Disp: 90 tablet, Rfl: 0   traZODone (DESYREL) 50 MG tablet, TAKE 1 TABLET BY MOUTH AT  BEDTIME, Disp: 90 tablet, Rfl: 0   triamcinolone ointment (KENALOG) 0.1 %, Apply topically., Disp: , Rfl:    venlafaxine XR (EFFEXOR-XR) 75 MG 24 hr capsule, TAKE 1 CAPSULE BY MOUTH TWO TIMES DAILY., Disp: 180 capsule, Rfl: 0  Past Medical History:  Diagnosis Date   Asthma    Blood clot in vein yrs ago   x 2 right leg   Colon polyps 2019   Constipation    Depression    GERD (gastroesophageal reflux disease)    History of kidney stones 04-11-20   "passed on own"   Hypertension    Left ovarian cyst    Varicose veins of both lower extremities    Objective:  PHYSICAL EXAM:   VS: BP 134/78 (BP Location: Left Arm, Patient Position: Sitting)   Pulse 67   Temp (!) 97.3 F (36.3 C) (Temporal)   Ht 5\' 5"  (1.651 m)   Wt 178 lb (80.7 kg)   SpO2 96%   BMI 29.62 kg/m   GEN: Well nourished, well developed, in no acute distress  Cardiac: RRR; no murmurs, rubs, or gallops,no edema -  Respiratory:  normal respiratory rate and pattern with no distress - normal breath sounds with no rales, rhonchi, wheezes or rubs GI: normal bowel sounds, no masses or tenderness MS: no deformity or atrophy  Skin: warm and dry, no rash  Neuro:  Alert and Oriented x 3,  CN II-Xii grossly intact Psych: euthymic mood, appropriate affect and demeanor   Assessment & Plan:    Essential hypertension  Continue med  Prediabetes -   Watch diet Body mass index (BMI) 30.0-30.9, adult -     titrate medication Watch diet Will do labwork at follow up visit  Follow-up: Return in about 3 months (around 08/25/2023) for chronic fasting follow-up.  An After Visit Summary was printed and given to the patient.  Jettie Pagan [facility] [phone]

## 2023-06-06 DIAGNOSIS — H903 Sensorineural hearing loss, bilateral: Secondary | ICD-10-CM | POA: Insufficient documentation

## 2023-06-06 HISTORY — DX: Sensorineural hearing loss, bilateral: H90.3

## 2023-06-26 ENCOUNTER — Other Ambulatory Visit: Payer: Self-pay | Admitting: Physician Assistant

## 2023-06-26 DIAGNOSIS — I10 Essential (primary) hypertension: Secondary | ICD-10-CM

## 2023-06-26 DIAGNOSIS — K219 Gastro-esophageal reflux disease without esophagitis: Secondary | ICD-10-CM

## 2023-06-26 DIAGNOSIS — F419 Anxiety disorder, unspecified: Secondary | ICD-10-CM

## 2023-07-16 ENCOUNTER — Other Ambulatory Visit: Payer: Self-pay | Admitting: Physician Assistant

## 2023-07-16 ENCOUNTER — Telehealth: Payer: Self-pay

## 2023-07-16 MED ORDER — QSYMIA 15-92 MG PO CP24: ORAL_CAPSULE | ORAL | 1 refills | Status: DC

## 2023-07-16 NOTE — Telephone Encounter (Signed)
Please call pt and clarify - she was on 7.5 for the past 3 months -the next dose is 11.25 not 15 Is she wanting to go to next dose?

## 2023-07-16 NOTE — Telephone Encounter (Unsigned)
Copied from CRM [id number]. Topic: Clinical - Prescription Issue >> Jul 16, 2023 12:54 PM Brittany Lawson wrote: Reason for CRM: The patient is calling in regards to her request for Qsymia 15 mg for weight loss. I advised the patient of the 3 business day turnaround time for prescription medication but she requested a call back at [phone] regarding this medication.

## 2023-07-16 NOTE — Telephone Encounter (Signed)
Patient stated that last time she was in office she was told to  start taking 2 of the 7.5 mg, and  that's why she was requesting the 15 mg. She stated if you want her to go to 11 mg she will. Patient needs medication sent to [facility] pharmacy

## 2023-07-16 NOTE — Telephone Encounter (Signed)
Sorry - did not see that she was doing 2 daily - will send in 15mg 

## 2023-07-16 NOTE — Telephone Encounter (Signed)
I sent it as a note because the medication she requested is not what we have on file, so I didn't know if maybe she wanted to up the dose

## 2023-07-16 NOTE — Telephone Encounter (Signed)
Is this a med refill then? - did you put in med request?

## 2023-08-24 ENCOUNTER — Ambulatory Visit: Payer: Medicare Other | Admitting: Physician Assistant

## 2023-08-27 ENCOUNTER — Ambulatory Visit (INDEPENDENT_AMBULATORY_CARE_PROVIDER_SITE_OTHER): Payer: Medicare Other | Admitting: Family Medicine

## 2023-08-27 ENCOUNTER — Encounter: Payer: Self-pay | Admitting: Family Medicine

## 2023-08-27 VITALS — BP 110/64 | HR 72 | Temp 97.6°F | Resp 16 | Ht 65.0 in | Wt 183.0 lb

## 2023-08-27 DIAGNOSIS — J45901 Unspecified asthma with (acute) exacerbation: Secondary | ICD-10-CM | POA: Insufficient documentation

## 2023-08-27 HISTORY — DX: Unspecified asthma with (acute) exacerbation: J45.901

## 2023-08-27 MED ORDER — TRIAMCINOLONE ACETONIDE 40 MG/ML IJ SUSP: Freq: Once | INTRAMUSCULAR | Status: DC

## 2023-08-27 MED ORDER — PREDNISONE 20 MG PO TABS: ORAL_TABLET | ORAL | 0 refills | Status: AC

## 2023-08-27 MED ORDER — TRIAMCINOLONE ACETONIDE 40 MG/ML IJ SUSP
40.0000 mg | Freq: Once | INTRAMUSCULAR | Status: AC
  Administered 2023-08-27: 40 mg via INTRAMUSCULAR

## 2023-08-27 NOTE — Progress Notes (Signed)
 Acute Office Visit  Subjective:    Patient ID: Brittany Lawson, female    DOB: 1956-02-12, 68 y.o.   MRN: [id number]  Chief Complaint  Patient presents with   Asthma    Discussed the use of AI scribe software for clinical note transcription with the patient, who gave verbal consent to proceed.  HPI: Brittany Lawson is a 68 year old female with asthma who presents with shortness of breath and asthma exacerbation.  She has been experiencing shortness of breath and breathing difficulties since last Tuesday, which she attributes to an asthma exacerbation. She uses albuterol  every four hours and Advair twice daily, with one puff in the morning and at bedtime. She typically receives a Kenalog  shot and a tapering dose of oral steroids for asthma exacerbations. She reports chest tightness but no ear pain or chest pain. There is no green or yellow drainage.  She believes she had the flu last week, which has mostly resolved as she has been fever-free for about 24 hours. Initially, she experienced a sore throat, but it has since resolved.   Past Medical History:  Diagnosis Date   Asthma    Blood clot in vein yrs ago   x 2 right leg   Colon polyps 2019   Constipation    Depression    GERD (gastroesophageal reflux disease)    History of kidney stones 04/08/20   "passed on own"   Hypertension    Left ovarian cyst    Varicose veins of both lower extremities     Past Surgical History:  Procedure Laterality Date   AUGMENTATION MAMMAPLASTY Bilateral age 57's age 69's     each redone 2018 right side calcification removed   BREAST EXCISIONAL BIOPSY Right    APPROX 20 YEARS AGO   CHOLECYSTECTOMY  age 77   [facility]   colonscopy  10-2019 and 05-2020   polyp removed both times due may 2022   ROBOTIC ASSISTED TOTAL HYSTERECTOMY WITH BILATERAL SALPINGO OOPHERECTOMY N/A 09/15/2020   Procedure: XI ROBOTIC ASSISTED TOTAL HYSTERECTOMY WITH BILATERAL SALPINGO OOPHORECTOMY;  Surgeon: Alphonso Aschoff, MD;   Location: [facility];  Service: Gynecology;  Laterality: N/A;   TUBAL LIGATION  age 9   UPPER GI ENDOSCOPY  05/2020    Family History  Problem Relation Age of Onset   Breast cancer Mother 28    Social History   Socioeconomic History   Marital status: Married    Spouse name: Afton   Number of children: 4   Years of education: 12   Highest education level: Not on file  Occupational History   Occupation: homemaker  Tobacco Use   Smoking status: Former    Current packs/day: 0.00    Average packs/day: 1.5 packs/day for 20.0 years (30.0 ttl pk-yrs)    Types: Cigarettes    Start date: 40    Quit date: 1996    Years since quitting: 29.1   Smokeless tobacco: Never   Tobacco comments:    quit 25 yrs ago  Vaping Use   Vaping status: Never Used  Substance and Sexual Activity   Alcohol use: Not Currently   Drug use: Never   Sexual activity: Yes    Partners: Male  Other Topics Concern   Not on file  Social History Narrative   Not on file   Social Drivers of Health   Financial Resource Strain: Low Risk  (01/03/2023)   Overall Financial Resource Strain (CARDIA)    Difficulty of  Paying Living Expenses: Not hard at all  Food Insecurity: No Food Insecurity (01/03/2023)   Hunger Vital Sign    Worried About Running Out of Food in the Last Year: Never true    Ran Out of Food in the Last Year: Never true  Transportation Needs: No Transportation Needs (01/03/2023)   PRAPARE - Administrator, Civil Service (Medical): No    Lack of Transportation (Non-Medical): No  Physical Activity: Insufficiently Active (02/21/2023)   Exercise Vital Sign    Days of Exercise per Week: 2 days    Minutes of Exercise per Session: 20 min  Stress: No Stress Concern Present (01/03/2023)   [employer] of Occupational Health - Occupational Stress Questionnaire    Feeling of Stress : Not at all  Social Connections: Moderately Isolated (02/21/2023)   Social Connection and  Isolation Panel [NHANES]    Frequency of Communication with Friends and Family: More than three times a week    Frequency of Social Gatherings with Friends and Family: Three times a week    Attends Religious Services: Never    Active Member of Clubs or Organizations: No    Attends Banker Meetings: Never    Marital Status: Married  Catering manager Violence: Not At Risk (01/03/2023)   Humiliation, Afraid, Rape, and Kick questionnaire    Fear of Current or Ex-Partner: No    Emotionally Abused: No    Physically Abused: No    Sexually Abused: No    Outpatient Medications Prior to Visit  Medication Sig Dispense Refill   albuterol  (VENTOLIN  HFA) 108 (90 Base) MCG/ACT inhaler INHALE 1 - 2 PUFFS EVERY 4 HOURS AS NEEDED 8.5 g 0   ALPRAZolam  (XANAX ) 0.5 MG tablet 1 po qd prn anxiety 30 tablet 1   amLODipine  (NORVASC ) 5 MG tablet TAKE 1 TABLET BY MOUTH DAILY 90 tablet 0   fluticasone -salmeterol (ADVAIR) 250-50 MCG/ACT AEPB INHALE 1 PUFF INTO THE LUNGS IN THE MORNING AND AT BEDTIME. 60 each 3   ibuprofen  (ADVIL ) 800 MG tablet Take 1 tablet (800 mg total) by mouth every 8 (eight) hours as needed for moderate pain. For AFTER surgery only 30 tablet 0   losartan  (COZAAR ) 100 MG tablet TAKE 1 TABLET BY MOUTH AT BEDTIME. 90 tablet 0   meclizine (ANTIVERT) 25 MG tablet Take 25 mg by mouth 3 (three) times daily as needed for dizziness.     montelukast  (SINGULAIR ) 10 MG tablet TAKE 1 TABLET BY MOUTH DAILY 90 tablet 0   pantoprazole  (PROTONIX ) 40 MG tablet TAKE 1 TABLET BY MOUTH TWICE DAILY 180 tablet 0   pravastatin  (PRAVACHOL ) 20 MG tablet TAKE 1 TABLET BY MOUTH DAILY. 90 tablet 0   traZODone  (DESYREL ) 50 MG tablet TAKE 1 TABLET BY MOUTH AT BEDTIME 90 tablet 0   triamcinolone  ointment (KENALOG ) 0.1 % Apply topically.     venlafaxine  XR (EFFEXOR -XR) 75 MG 24 hr capsule TAKE 1 CAPSULE BY MOUTH TWO TIMES DAILY. 180 capsule 0   Phentermine -Topiramate  (QSYMIA ) 15-92 MG CP24 1 po qd (Patient not  taking: Reported on 08/27/2023) 30 capsule 1   No facility-administered medications prior to visit.    Allergies  Allergen Reactions   Sulfa Antibiotics Nausea Only, Other (See Comments) and Nausea And Vomiting    Childhood reaction  Unknown reaction    Review of Systems  Constitutional:  Positive for fatigue. Negative for fever.  HENT: Negative.    Eyes: Negative.   Respiratory:  Positive for cough, chest tightness,  shortness of breath and wheezing.   Cardiovascular:  Negative for chest pain.  Gastrointestinal:  Negative for abdominal pain, constipation, diarrhea, nausea and vomiting.  Genitourinary:  Negative for dysuria, frequency and urgency.  Musculoskeletal:  Negative for arthralgias and myalgias.  Skin: Negative.   Allergic/Immunologic: Negative.   Neurological:  Negative for weakness and headaches.  Psychiatric/Behavioral:  Positive for sleep disturbance (coughing). Negative for dysphoric mood. The patient is not nervous/anxious.        Objective:        08/27/2023   10:49 AM 05/25/2023    9:25 AM 04/20/2023   10:33 AM  Vitals with BMI  Height 5\' 5"  5\' 5"  5\' 5"   Weight 183 lbs 178 lbs 178 lbs 6 oz  BMI 30.45 29.62 29.69  Systolic 110 134 161  Diastolic 64 78 70  Pulse 72 67 66    Orthostatic VS for the past 72 hrs (Last 3 readings):  Patient Position BP Location Cuff Size  08/27/23 1049 Sitting Right Arm Large     Physical Exam Constitutional:      General: She is not in acute distress.    Appearance: Normal appearance. She is ill-appearing.  Eyes:     Conjunctiva/sclera: Conjunctivae normal.  Cardiovascular:     Rate and Rhythm: Normal rate and regular rhythm.     Heart sounds: Normal heart sounds. No murmur heard. Pulmonary:     Effort: Pulmonary effort is normal.     Breath sounds: Examination of the right-lower field reveals decreased breath sounds. Examination of the left-lower field reveals decreased breath sounds. Decreased breath sounds  present. No wheezing.  Musculoskeletal:        General: Normal range of motion.  Lymphadenopathy:     Cervical: No cervical adenopathy.  Skin:    General: Skin is warm.  Neurological:     Mental Status: She is alert. Mental status is at baseline.  Psychiatric:        Mood and Affect: Mood normal.        Behavior: Behavior normal.     Health Maintenance Due  Topic Date Due   DTaP/Tdap/Td (1 - Tdap) Never done   Colonoscopy  11/17/2023    There are no preventive care reminders to display for this patient.   Lab Results  Component Value Date   TSH 2.590 02/21/2023   Lab Results  Component Value Date   WBC 6.0 02/21/2023   HGB 13.8 02/21/2023   HCT 42.7 02/21/2023   MCV 80 02/21/2023   PLT 279 02/21/2023   Lab Results  Component Value Date   NA 141 02/21/2023   K 4.9 02/21/2023   CO2 23 02/21/2023   GLUCOSE 110 (H) 02/21/2023   BUN 16 02/21/2023   CREATININE 0.97 02/21/2023   BILITOT 0.4 02/21/2023   ALKPHOS 85 02/21/2023   AST 24 02/21/2023   ALT 19 02/21/2023   PROT 7.6 02/21/2023   ALBUMIN 4.4 02/21/2023   CALCIUM 9.6 02/21/2023   ANIONGAP 12 09/13/2020   EGFR 64 02/21/2023   Lab Results  Component Value Date   CHOL 182 02/21/2023   Lab Results  Component Value Date   HDL 69 02/21/2023   Lab Results  Component Value Date   LDLCALC 90 02/21/2023   Lab Results  Component Value Date   TRIG 130 02/21/2023   Lab Results  Component Value Date   CHOLHDL 2.6 02/21/2023   Lab Results  Component Value Date   HGBA1C 6.1 (H) 02/21/2023  Assessment & Plan:  Asthma in adult, unspecified asthma severity, with acute exacerbation Assessment & Plan: Recent flu infection with persistent shortness of breath and increased use of albuterol . Decreased lung sounds on examination, but no audible crackles. Currently on Advair twice daily. -Administer Kenalog  shot today for acute inflammation. -Start oral steroid taper:60mg  for 3 days, 40mg  for 3 days,  20mg  for 3 days. -Consider adding Mucinex in the morning to help with expectoration. Increase fluid intake when taking Mucinex. -Continue Advair twice daily and albuterol  as needed.  Orders: -     bupivacaine (PF) (MARCAINE ) 0.5 % 10 mL, triamcinolone  acetonide (KENALOG -40) 40 mg injection  Other orders -     predniSONE ; Take 3 tablets (60 mg total) by mouth daily with breakfast for 3 days, THEN 2 tablets (40 mg total) daily with breakfast for 3 days, THEN 1 tablet (20 mg total) daily with breakfast for 3 days.  Dispense: 18 tablet; Refill: 0     Meds ordered this encounter  Medications   bupivacaine (PF) (MARCAINE ) 0.5 % 10 mL, triamcinolone  acetonide (KENALOG -40) 40 mg injection   predniSONE  (DELTASONE ) 20 MG tablet    Sig: Take 3 tablets (60 mg total) by mouth daily with breakfast for 3 days, THEN 2 tablets (40 mg total) daily with breakfast for 3 days, THEN 1 tablet (20 mg total) daily with breakfast for 3 days.    Dispense:  18 tablet    Refill:  0    No orders of the defined types were placed in this encounter.    Follow-up: Return if symptoms worsen or fail to improve.  An After Visit Summary was printed and given to the patient.  Total time spent on today's visit was 23 minutes, including both face-to-face time and nonface-to-face time personally spent on review of chart (labs and imaging), discussing labs and goals, discussing further work-up, treatment options, referrals to specialist if needed, reviewing outside records if pertinent, answering patient's questions, and coordinating care.    Delford Felling, FNP [facility] [phone]

## 2023-08-27 NOTE — Assessment & Plan Note (Signed)
 Recent flu infection with persistent shortness of breath and increased use of albuterol . Decreased lung sounds on examination, but no audible crackles. Currently on Advair twice daily. -Administer Kenalog  shot today for acute inflammation. -Start oral steroid taper:60mg  for 3 days, 40mg  for 3 days, 20mg  for 3 days. -Consider adding Mucinex in the morning to help with expectoration. Increase fluid intake when taking Mucinex. -Continue Advair twice daily and albuterol  as needed.

## 2023-08-27 NOTE — Addendum Note (Signed)
 Addended by: Angel Weedon K on: 08/27/2023 01:10 PM   Modules accepted: Orders

## 2023-08-30 ENCOUNTER — Ambulatory Visit: Payer: Medicare Other

## 2023-08-30 ENCOUNTER — Ambulatory Visit: Payer: Self-pay | Admitting: Physician Assistant

## 2023-08-30 ENCOUNTER — Ambulatory Visit (HOSPITAL_BASED_OUTPATIENT_CLINIC_OR_DEPARTMENT_OTHER)
Admission: RE | Admit: 2023-08-30 | Discharge: 2023-08-30 | Disposition: A | Discharge status: Home / Self Care | Payer: Medicare Other | Source: Ambulatory Visit

## 2023-08-30 VITALS — BP 138/82 | HR 82 | Temp 97.1°F | Ht 65.0 in | Wt 184.0 lb

## 2023-08-30 DIAGNOSIS — R0602 Shortness of breath: Secondary | ICD-10-CM

## 2023-08-30 DIAGNOSIS — J4531 Mild persistent asthma with (acute) exacerbation: Secondary | ICD-10-CM

## 2023-08-30 HISTORY — DX: Mild persistent asthma with (acute) exacerbation: J45.31

## 2023-08-30 NOTE — Telephone Encounter (Signed)
Chief Complaint: SOB, asthma attack Symptoms: SOB, wheezing, asthma, congestion Frequency: 1 wk Pertinent Negatives: Patient denies fever Disposition: [x] ED /[] Urgent Care (no appt availability in office) / [] Appointment(In office/virtual)/ []  [facility]/ [] Home Care/ [x] Refused Recommended Disposition /[] [place] [facility]/ []  Follow-up with PCP Additional Notes: Pt reports worsening asthma. States she was diagnosed with the flu and has been having SOB since. Seen in office 2/10. Given a Kenalog injection and prescribed a prednisone taper. Pt states she has been taking the prednisone as prescribed and is not improving. States she is using albuterol q 4 hrs with no improvement. Pt states her SOB is moderate "ranging on severe." No SOB at rest but states she becomes extremely SOB with any movement. Per protocol, and since pt has a hx of severe asthma and is not improving with her inhaler and pred, RN advised pt she needs to go to the ED. Pt refused, said she does not have time to do that today and she needs to pick up her grandchildren. Pt went on to say that she was told that if she would like to speak to the office directly, she is permitted to be transferred through to them. RN called the [facility] and informed them of the situation. RN conferenced pt and CAL and dropped off the call.    Copied from CRM [id number]. Topic: Clinical - Red Word Triage >> Aug 30, 2023 11:27 AM Higinio Roger wrote: Red Word that prompted transfer to Nurse Triage: Difficulty breathing.  Patient states she is having difficultly breathing and unable to catch her breath. She states the new asthma medication is not working. Reason for Disposition  [1] MODERATE difficulty breathing (e.g., speaks in phrases, SOB even at rest, pulse 100-120) AND [2] NEW-onset or WORSE than normal  Answer Assessment - Initial Assessment Questions 1. RESPIRATORY STATUS: "Describe your breathing?" (e.g., wheezing, shortness of breath,  unable to speak, severe coughing)      "Some wheezing" 2. ONSET: "When did this breathing problem begin?"  - using albuterol inhaler q 4 hrs and pt states it does not help "much"     Hx of asthma, started last week. Seen on 2/7 for flu/asthma, prescribed prednisone which she has been taking. 3. PATTERN "Does the difficult breathing come and go, or has it been constant since it started?"      Constant 4. SEVERITY: "How bad is your breathing?" (e.g., mild, moderate, severe)    - MILD: No SOB at rest, mild SOB with walking, speaks normally in sentences, can lie down, no retractions, pulse < 100.    - MODERATE: SOB at rest, SOB with minimal exertion and prefers to sit, cannot lie down flat, speaks in phrases, mild retractions, audible wheezing, pulse 100-120.    - SEVERE: Very SOB at rest, speaks in single words, struggling to breathe, sitting hunched forward, retractions, pulse > 120      "I do pretty good at night, but if I move around at all I am totally out of breath" - SOB is moderate, "close to severe" 5. RECURRENT SYMPTOM: "Have you had difficulty breathing before?" If Yes, ask: "When was the last time?" and "What happened that time?"      Yes 6. CARDIAC HISTORY: "Do you have any history of heart disease?" (e.g., heart attack, angina, bypass surgery, angioplasty)      HTN 7. LUNG HISTORY: "Do you have any history of lung disease?"  (e.g., pulmonary embolus, asthma, emphysema)     Just asthma  8. CAUSE: "What do you think is causing the breathing problem?"      Asthma and flu 9. OTHER SYMPTOMS: "Do you have any other symptoms? (e.g., dizziness, runny nose, cough, chest pain, fever)     Congestion 10. O2 SATURATION MONITOR:  "Do you use an oxygen saturation monitor (pulse oximeter) at home?" If Yes, ask: "What is your reading (oxygen level) today?" "What is your usual oxygen saturation reading?" (e.g., 95%)       "I don't have one" - pt states she is driving right now and sitting still, but SOB  was much worse when she was trying to get into car  Protocols used: Breathing Difficulty-A-AH

## 2023-08-30 NOTE — Assessment & Plan Note (Signed)
Worsening asthma symptoms, including cough and dyspnea, despite recent Kenalog injection and prednisone taper. 3 days ago.  Symptoms persist for over a week with increased fatigue and nocturnal cough. No fever or chest pain, but reports heat sensation likely due to coughing. Informed no additional medications can be given due to recent high-dose steroid treatment. - Order chest x-ray to rule out pneumonia. - Continue prednisone taper as prescribed (40 mg for three days, 20 mg for three days) - Use inhalers as directed- rescue albuterol and maintenance advair.  - If chest x-ray confirms pneumonia, prescribe a strong antibiotic - Seek emergency care if experiencing severe chest tightness or inability to breathe   Persistent symptoms and lack of improvement with high-dose prednisone raise concern for pneumonia. No recent chest x-ray. No pneumonia auscultated, but imaging needed to rule out. - Order chest x-ray to rule out pneumonia - If chest x-ray confirms pneumonia, prescribe a strong antibiotic  Follow-up - Get chest x-ray at the med center in [place] - Call with chest x-ray results - Continue current medications and inhalers as prescribed - Seek emergency care if symptoms worsen.

## 2023-08-30 NOTE — Progress Notes (Signed)
Acute Office Visit  Subjective:    Patient ID: [name], female    DOB: 14-Feb-1956, 68 y.o.   MRN: [id number]  Chief Complaint  Patient presents with   Fever   Shortness of Breath    Discussed the use of AI scribe software for clinical note transcription with the patient, who gave verbal consent to proceed.      HPI: Brittany Lawson is a 68 year old female with asthma who presents with worsening cough and shortness of breath.  She has been experiencing worsening cough and shortness of breath, similar to symptoms from a previous visit on February 10th, which began approximately a week prior to that visit. Despite using her rescue and maintenance inhalers, there has been no improvement in her symptoms. She then mentions that she does not feel the symptoms are getting worse, but they are not going away. Main concern is fatigue.   During her last visit, she received a Kenalog injection and was prescribed an oral steroid taper starting with 60 mg for three days, followed by 40 mg for three days, and 20 mg for three days. She has only completed the initial three days of the high-dose steroid and still has the 40 mg and 20 mg doses remaining. She feels worse despite the high-dose prednisone, with increasing fatigue and inability to catch her breath.  She has stairs in her house but does not use them generally as there is no need for her to use them.  No sore throat or chest pain, although she describes a sensation of heat, likely from coughing. No fever since the previous Saturday. Her cough worsens at night and the longer her symptoms persist, the more tired she becomes.  She did not want to go to the ED.  Past Medical History:  Diagnosis Date   Asthma    Blood clot in vein yrs ago   x 2 right leg   Colon polyps 2019   Constipation    Depression    GERD (gastroesophageal reflux disease)    History of kidney stones 04-07-2020   "passed on own"   Hypertension    Left ovarian  cyst    Varicose veins of both lower extremities     Past Surgical History:  Procedure Laterality Date   AUGMENTATION MAMMAPLASTY Bilateral age 67's age 41's     each redone 2018 right side calcification removed   BREAST EXCISIONAL BIOPSY Right    APPROX 20 YEARS AGO   CHOLECYSTECTOMY  age 53   [facility]   colonscopy  10-2019 and 05-2020   polyp removed both times due may 2022   ROBOTIC ASSISTED TOTAL HYSTERECTOMY WITH BILATERAL SALPINGO OOPHERECTOMY N/A 09/15/2020   Procedure: XI ROBOTIC ASSISTED TOTAL HYSTERECTOMY WITH BILATERAL SALPINGO OOPHORECTOMY;  Surgeon: Adolphus Birchwood, MD;  Location: [facility];  Service: Gynecology;  Laterality: N/A;   TUBAL LIGATION  age 38   UPPER GI ENDOSCOPY  05/2020    Family History  Problem Relation Age of Onset   Breast cancer Mother 28    Social History   Socioeconomic History   Marital status: Married    Spouse name: Afton   Number of children: 4   Years of education: 12   Highest education level: Not on file  Occupational History   Occupation: homemaker  Tobacco Use   Smoking status: Former    Current packs/day: 0.00    Average packs/day: 1.5 packs/day for 20.0 years (30.0 ttl pk-yrs)  Types: Cigarettes    Start date: 67    Quit date: 1996    Years since quitting: 29.1   Smokeless tobacco: Never   Tobacco comments:    quit 25 yrs ago  Vaping Use   Vaping status: Never Used  Substance and Sexual Activity   Alcohol use: Not Currently   Drug use: Never   Sexual activity: Yes    Partners: Male  Other Topics Concern   Not on file  Social History Narrative   Not on file   Social Drivers of Health   Financial Resource Strain: Low Risk  (01/03/2023)   Overall Financial Resource Strain (CARDIA)    Difficulty of Paying Living Expenses: Not hard at all  Food Insecurity: No Food Insecurity (01/03/2023)   Hunger Vital Sign    Worried About Running Out of Food in the Last Year: Never true    Ran Out of Food in the Last  Year: Never true  Transportation Needs: No Transportation Needs (01/03/2023)   PRAPARE - Administrator, Civil Service (Medical): No    Lack of Transportation (Non-Medical): No  Physical Activity: Insufficiently Active (02/21/2023)   Exercise Vital Sign    Days of Exercise per Week: 2 days    Minutes of Exercise per Session: 20 min  Stress: No Stress Concern Present (01/03/2023)   [employer] of Occupational Health - Occupational Stress Questionnaire    Feeling of Stress : Not at all  Social Connections: Moderately Isolated (02/21/2023)   Social Connection and Isolation Panel [NHANES]    Frequency of Communication with Friends and Family: More than three times a week    Frequency of Social Gatherings with Friends and Family: Three times a week    Attends Religious Services: Never    Active Member of Clubs or Organizations: No    Attends Banker Meetings: Never    Marital Status: Married  Catering manager Violence: Not At Risk (01/03/2023)   Humiliation, Afraid, Rape, and Kick questionnaire    Fear of Current or Ex-Partner: No    Emotionally Abused: No    Physically Abused: No    Sexually Abused: No    Outpatient Medications Prior to Visit  Medication Sig Dispense Refill   albuterol (VENTOLIN HFA) 108 (90 Base) MCG/ACT inhaler INHALE 1 - 2 PUFFS EVERY 4 HOURS AS NEEDED 8.5 g 0   ALPRAZolam (XANAX) 0.5 MG tablet 1 po qd prn anxiety 30 tablet 1   amLODipine (NORVASC) 5 MG tablet TAKE 1 TABLET BY MOUTH DAILY 90 tablet 0   fluticasone-salmeterol (ADVAIR) 250-50 MCG/ACT AEPB INHALE 1 PUFF INTO THE LUNGS IN THE MORNING AND AT BEDTIME. 60 each 3   ibuprofen (ADVIL) 800 MG tablet Take 1 tablet (800 mg total) by mouth every 8 (eight) hours as needed for moderate pain. For AFTER surgery only 30 tablet 0   losartan (COZAAR) 100 MG tablet TAKE 1 TABLET BY MOUTH AT BEDTIME. 90 tablet 0   meclizine (ANTIVERT) 25 MG tablet Take 25 mg by mouth 3 (three) times daily as needed  for dizziness.     montelukast (SINGULAIR) 10 MG tablet TAKE 1 TABLET BY MOUTH DAILY 90 tablet 0   pantoprazole (PROTONIX) 40 MG tablet TAKE 1 TABLET BY MOUTH TWICE DAILY 180 tablet 0   Phentermine-Topiramate (QSYMIA) 15-92 MG CP24 1 po qd 30 capsule 1   pravastatin (PRAVACHOL) 20 MG tablet TAKE 1 TABLET BY MOUTH DAILY. 90 tablet 0   predniSONE (DELTASONE) 20 MG  tablet Take 3 tablets (60 mg total) by mouth daily with breakfast for 3 days, THEN 2 tablets (40 mg total) daily with breakfast for 3 days, THEN 1 tablet (20 mg total) daily with breakfast for 3 days. 18 tablet 0   traZODone (DESYREL) 50 MG tablet TAKE 1 TABLET BY MOUTH AT BEDTIME 90 tablet 0   triamcinolone ointment (KENALOG) 0.1 % Apply topically.     venlafaxine XR (EFFEXOR-XR) 75 MG 24 hr capsule TAKE 1 CAPSULE BY MOUTH TWO TIMES DAILY. 180 capsule 0   Facility-Administered Medications Prior to Visit  Medication Dose Route Frequency Provider Last Rate Last Admin   bupivacaine(PF) (MARCAINE) 0.5 % 10 mL, triamcinolone acetonide (KENALOG-40) 40 mg injection   Subcutaneous Once         Allergies  Allergen Reactions   Sulfa Antibiotics Nausea Only, Other (See Comments) and Nausea And Vomiting    Childhood reaction  Unknown reaction    Review of Systems  Constitutional:  Positive for fatigue. Negative for chills and fever.  HENT:  Negative for congestion, ear pain and sinus pain.   Respiratory:  Positive for shortness of breath. Negative for cough.   Cardiovascular:  Negative for chest pain.  Gastrointestinal:  Negative for abdominal pain, constipation, diarrhea, nausea and vomiting.  Musculoskeletal:  Negative for myalgias.  Neurological:  Negative for headaches.       Objective:        08/30/2023    4:05 PM 08/30/2023    3:49 PM 08/27/2023   10:49 AM  Vitals with BMI  Height  5\' 5"  5\' 5"   Weight  184 lbs 183 lbs  BMI  30.62 30.45  Systolic 138 152 161  Diastolic 82 72 64  Pulse  82 72    No data found.    Physical Exam Vitals and nursing note reviewed.  HENT:     Head: Normocephalic and atraumatic.     Mouth/Throat:     Mouth: Mucous membranes are moist.  Cardiovascular:     Rate and Rhythm: Normal rate and regular rhythm.  Pulmonary:     Effort: Pulmonary effort is normal. No tachypnea, bradypnea or respiratory distress.     Breath sounds: Normal breath sounds. No stridor. No decreased breath sounds or wheezing.  Musculoskeletal:     Cervical back: Normal range of motion.  Neurological:     Mental Status: She is alert.     Health Maintenance Due  Topic Date Due   DTaP/Tdap/Td (1 - Tdap) Never done   Colonoscopy  11/17/2023    There are no preventive care reminders to display for this patient.   Lab Results  Component Value Date   TSH 2.590 02/21/2023   Lab Results  Component Value Date   WBC 6.0 02/21/2023   HGB 13.8 02/21/2023   HCT 42.7 02/21/2023   MCV 80 02/21/2023   PLT 279 02/21/2023   Lab Results  Component Value Date   NA 141 02/21/2023   K 4.9 02/21/2023   CO2 23 02/21/2023   GLUCOSE 110 (H) 02/21/2023   BUN 16 02/21/2023   CREATININE 0.97 02/21/2023   BILITOT 0.4 02/21/2023   ALKPHOS 85 02/21/2023   AST 24 02/21/2023   ALT 19 02/21/2023   PROT 7.6 02/21/2023   ALBUMIN 4.4 02/21/2023   CALCIUM 9.6 02/21/2023   ANIONGAP 12 09/13/2020   EGFR 64 02/21/2023   Lab Results  Component Value Date   CHOL 182 02/21/2023   Lab Results  Component Value Date  HDL 69 02/21/2023   Lab Results  Component Value Date   LDLCALC 90 02/21/2023   Lab Results  Component Value Date   TRIG 130 02/21/2023   Lab Results  Component Value Date   CHOLHDL 2.6 02/21/2023   Lab Results  Component Value Date   HGBA1C 6.1 (H) 02/21/2023       Assessment & Plan:  Asthma in adult, mild persistent, with acute exacerbation Assessment & Plan: Worsening asthma symptoms, including cough and dyspnea, despite recent Kenalog injection and prednisone taper. 3  days ago.  Symptoms persist for over a week with increased fatigue and nocturnal cough. No fever or chest pain, but reports heat sensation likely due to coughing. Informed no additional medications can be given due to recent high-dose steroid treatment. - Order chest x-ray to rule out pneumonia. - Continue prednisone taper as prescribed (40 mg for three days, 20 mg for three days) - Use inhalers as directed- rescue albuterol and maintenance advair.  - If chest x-ray confirms pneumonia, prescribe a strong antibiotic - Seek emergency care if experiencing severe chest tightness or inability to breathe   Persistent symptoms and lack of improvement with high-dose prednisone raise concern for pneumonia. No recent chest x-ray. No pneumonia auscultated, but imaging needed to rule out. - Order chest x-ray to rule out pneumonia - If chest x-ray confirms pneumonia, prescribe a strong antibiotic  Follow-up - Get chest x-ray at the med center in [place] - Call with chest x-ray results - Continue current medications and inhalers as prescribed - Seek emergency care if symptoms worsen.  Orders: -     DG Chest 2 View; Future  Shortness of breath -     DG Chest 2 View; Future     No orders of the defined types were placed in this encounter.   Orders Placed This Encounter  Procedures   DG Chest 2 View     Follow-up: No follow-ups on file.  An After Visit Summary was printed and given to the patient.  Windell Moment, MD [facility] [phone]

## 2023-08-31 ENCOUNTER — Other Ambulatory Visit: Payer: Self-pay | Admitting: Physician Assistant

## 2023-08-31 ENCOUNTER — Ambulatory Visit: Payer: Medicare Other | Admitting: Physician Assistant

## 2023-09-03 ENCOUNTER — Ambulatory Visit: Payer: Medicare Other | Admitting: Physician Assistant

## 2023-09-10 ENCOUNTER — Ambulatory Visit (INDEPENDENT_AMBULATORY_CARE_PROVIDER_SITE_OTHER): Payer: Medicare Other | Admitting: Physician Assistant

## 2023-09-10 ENCOUNTER — Encounter: Payer: Self-pay | Admitting: Physician Assistant

## 2023-09-10 VITALS — BP 128/64 | HR 79 | Temp 98.0°F | Resp 16 | Ht 65.0 in | Wt 185.0 lb

## 2023-09-10 DIAGNOSIS — I10 Essential (primary) hypertension: Secondary | ICD-10-CM

## 2023-09-10 DIAGNOSIS — R7303 Prediabetes: Secondary | ICD-10-CM

## 2023-09-10 DIAGNOSIS — R151 Fecal smearing: Secondary | ICD-10-CM

## 2023-09-10 DIAGNOSIS — E559 Vitamin D deficiency, unspecified: Secondary | ICD-10-CM

## 2023-09-10 DIAGNOSIS — J4520 Mild intermittent asthma, uncomplicated: Secondary | ICD-10-CM

## 2023-09-10 DIAGNOSIS — E782 Mixed hyperlipidemia: Secondary | ICD-10-CM | POA: Diagnosis not present

## 2023-09-10 DIAGNOSIS — K219 Gastro-esophageal reflux disease without esophagitis: Secondary | ICD-10-CM

## 2023-09-10 HISTORY — DX: Prediabetes: R73.03

## 2023-09-10 NOTE — Progress Notes (Signed)
 Subjective:  Patient ID: Brittany Lawson, female    DOB: September 17, 1955  Age: 68 y.o. MRN: [id number]  Chief Complaint  Patient presents with   Medical Management of Chronic Issues    89M    HPI Pt presents for follow up of hypertension.  The patient is tolerating the medication well without side effects. Compliance with treatment has been good; including taking medication as directed , maintains a healthy diet and regular exercise regimen , and following up as directed.she is taking norvasc 5mg  qd and losartan 100mg  qd Denies chest pain/sob  Pt with anxiety - stable on current medications of xanax prn and effexor 75mg  - she also takes trazodone 50mg  at bedtime for insomina  Pt with GERD - stable on protonix 40mg  qd  Mixed hyperlipidemia  Pt presents with hyperlipidemia. Compliance with treatment has been good -The patient is compliant with medications, maintains a low cholesterol diet , follows up as directed , and maintains an exercise regimen . The patient denies experiencing any hypercholesterolemia related symptoms. Taking pravachol 20mg  qd  Pt with asthma - symptoms stable on proair and advair now --- recently had flu and flare of asthma Had chest xray which did not show pneumonia  Pt has been having episodes of mild fecal incontinence - is agreeable for GI referral        09/10/2023    2:26 PM 05/25/2023    9:30 AM 02/21/2023    8:53 AM 01/03/2023   10:04 AM 08/16/2022    9:19 AM  Depression screen PHQ 2/9  Decreased Interest 1 1 0 0 0  Down, Depressed, Hopeless 2 1 0 0 0  PHQ - 2 Score 3 2 0 0 0  Altered sleeping 2 1 0  0  Tired, decreased energy 2 1 0  0  Change in appetite 0 1 0  1  Feeling bad or failure about yourself  0 1 0  0  Trouble concentrating 0 0 0  0  Moving slowly or fidgety/restless 0 0 0  0  Suicidal thoughts 0 0 0  0  PHQ-9 Score 7 6 0  1  Difficult doing work/chores  Not difficult at all Not difficult at all  Not difficult at all        01/03/2023    10:04 AM 02/21/2023    8:53 AM 05/25/2023    9:30 AM 08/30/2023    3:52 PM 09/10/2023    2:26 PM  Fall Risk  Falls in the past year? 0 0 0 0 0  Was there an injury with Fall? 0 0 0 0 0  Fall Risk Category Calculator 0 0 0 0 0  Patient at Risk for Falls Due to No Fall Risks No Fall Risks No Fall Risks No Fall Risks No Fall Risks  Fall risk Follow up Falls evaluation completed;Education provided Falls evaluation completed Falls evaluation completed  Falls evaluation completed    CONSTITUTIONAL: Negative for chills, fatigue, fever, unintentional weight gain and unintentional weight loss.  E/N/T: Negative for ear pain, nasal congestion and sore throat.  CARDIOVASCULAR: Negative for chest pain, dizziness, palpitations and pedal edema.  RESPIRATORY: Negative for recent cough and dyspnea.  GASTROINTESTINAL: see HPI MSK: Negative for arthralgias and myalgias.  INTEGUMENTARY: Negative for rash.   PSYCHIATRIC: Negative for sleep disturbance and to question depression screen.  Negative for depression, negative for anhedonia.       Current Outpatient Medications:    albuterol (VENTOLIN HFA) 108 (90 Base) MCG/ACT inhaler,  INHALE 1 - 2 PUFFS EVERY 4 HOURS AS NEEDED, Disp: 8.5 g, Rfl: 0   ALPRAZolam (XANAX) 0.5 MG tablet, 1 po qd prn anxiety, Disp: 30 tablet, Rfl: 1   amLODipine (NORVASC) 5 MG tablet, TAKE 1 TABLET BY MOUTH DAILY, Disp: 90 tablet, Rfl: 0   fluticasone-salmeterol (ADVAIR) 250-50 MCG/ACT AEPB, INHALE 1 PUFF INTO THE LUNGS IN THE MORNING AND AT BEDTIME., Disp: 60 each, Rfl: 2   ibuprofen (ADVIL) 800 MG tablet, Take 1 tablet (800 mg total) by mouth every 8 (eight) hours as needed for moderate pain. For AFTER surgery only, Disp: 30 tablet, Rfl: 0   losartan (COZAAR) 100 MG tablet, TAKE 1 TABLET BY MOUTH AT BEDTIME., Disp: 90 tablet, Rfl: 0   meclizine (ANTIVERT) 25 MG tablet, Take 25 mg by mouth 3 (three) times daily as needed for dizziness., Disp: , Rfl:    montelukast (SINGULAIR) 10 MG  tablet, TAKE 1 TABLET BY MOUTH DAILY, Disp: 90 tablet, Rfl: 0   pantoprazole (PROTONIX) 40 MG tablet, TAKE 1 TABLET BY MOUTH TWICE DAILY, Disp: 180 tablet, Rfl: 0   pravastatin (PRAVACHOL) 20 MG tablet, TAKE 1 TABLET BY MOUTH DAILY., Disp: 90 tablet, Rfl: 0   traZODone (DESYREL) 50 MG tablet, TAKE 1 TABLET BY MOUTH AT BEDTIME, Disp: 90 tablet, Rfl: 0   triamcinolone ointment (KENALOG) 0.1 %, Apply topically., Disp: , Rfl:    venlafaxine XR (EFFEXOR-XR) 75 MG 24 hr capsule, TAKE 1 CAPSULE BY MOUTH TWO TIMES DAILY., Disp: 180 capsule, Rfl: 0  Current Facility-Administered Medications:    bupivacaine(PF) (MARCAINE) 0.5 % 10 mL, triamcinolone acetonide (KENALOG-40) 40 mg injection, , Subcutaneous, Once,   Past Medical History:  Diagnosis Date   Asthma    Blood clot in vein yrs ago   x 2 right leg   Colon polyps 2019   Constipation    Depression    GERD (gastroesophageal reflux disease)    History of kidney stones 2020/04/08   "passed on own"   Hypertension    Left ovarian cyst    Varicose veins of both lower extremities    Objective:  PHYSICAL EXAM:   VS: BP 128/64   Pulse 79   Temp 98 F (36.7 C)   Resp 16   Ht 5\' 5"  (1.651 m)   Wt 185 lb (83.9 kg)   SpO2 95%   BMI 30.79 kg/m   GEN: Well nourished, well developed, in no acute distress   Cardiac: RRR; no murmurs, rubs, or gallops,no edema -  Respiratory:  normal respiratory rate and pattern with no distress - normal breath sounds with no rales, rhonchi, wheezes or rubs  MS: no deformity or atrophy  Skin: warm and dry, no rash  Neuro:  Alert and Oriented x 3, - CN II-Xii grossly intact Psych: euthymic mood, appropriate affect and demeanor   Assessment & Plan:    Essential hypertension -     CBC with Differential/Platelet -     Comprehensive metabolic panel -     TSH Continue med Mixed hyperlipidemia -     Comprehensive metabolic panel -     Lipid panel Continue med - watch diet Vitamin D insufficiency -      VITAMIN D 25 Hydroxy (Vit-D Deficiency, Fractures)  Gastroesophageal reflux disease without esophagitis Continue protonix 40mg  qd Prediabetes -     Hemoglobin A1c Watch diet Fecal smearing Refer to GI  Mild intermittent asthma Continue current meds     Follow-up: Return in about 6 months (  around 03/09/2024) for chronic fasting follow-up -- fasting labs nurse visit tomorrow.  An After Visit Summary was printed and given to the patient.  Jettie Pagan [facility] [phone]

## 2023-09-11 ENCOUNTER — Other Ambulatory Visit: Payer: Medicare Other

## 2023-09-11 DIAGNOSIS — I10 Essential (primary) hypertension: Secondary | ICD-10-CM

## 2023-09-11 DIAGNOSIS — E559 Vitamin D deficiency, unspecified: Secondary | ICD-10-CM

## 2023-09-11 DIAGNOSIS — E782 Mixed hyperlipidemia: Secondary | ICD-10-CM

## 2023-09-11 DIAGNOSIS — R7303 Prediabetes: Secondary | ICD-10-CM

## 2023-09-12 ENCOUNTER — Encounter: Payer: Self-pay | Admitting: Physician Assistant

## 2023-09-12 LAB — CBC WITH DIFFERENTIAL/PLATELET
Basophils Absolute: 0.1 10*3/uL (ref 0.0–0.2)
Basos: 1 %
EOS (ABSOLUTE): 0.2 10*3/uL (ref 0.0–0.4)
Eos: 3 %
Hematocrit: 37.7 % (ref 34.0–46.6)
Hemoglobin: 12.4 g/dL (ref 11.1–15.9)
Immature Grans (Abs): 0 10*3/uL (ref 0.0–0.1)
Immature Granulocytes: 0 %
Lymphocytes Absolute: 2.1 10*3/uL (ref 0.7–3.1)
Lymphs: 25 %
MCH: 27.5 pg (ref 26.6–33.0)
MCHC: 32.9 g/dL (ref 31.5–35.7)
MCV: 84 fL (ref 79–97)
Monocytes Absolute: 0.8 10*3/uL (ref 0.1–0.9)
Monocytes: 10 %
Neutrophils Absolute: 5 10*3/uL (ref 1.4–7.0)
Neutrophils: 61 %
Platelets: 268 10*3/uL (ref 150–450)
RBC: 4.51 x10E6/uL (ref 3.77–5.28)
RDW: 13.1 % (ref 11.7–15.4)
WBC: 8.1 10*3/uL (ref 3.4–10.8)

## 2023-09-12 LAB — LIPID PANEL
Chol/HDL Ratio: 2.2 {ratio} (ref 0.0–4.4)
Cholesterol, Total: 177 mg/dL (ref 100–199)
HDL: 81 mg/dL (ref >39)
LDL Chol Calc (NIH): 80 mg/dL (ref 0–99)
Triglycerides: 88 mg/dL (ref 0–149)
VLDL Cholesterol Cal: 16 mg/dL (ref 5–40)

## 2023-09-12 LAB — COMPREHENSIVE METABOLIC PANEL WITH GFR
ALT: 18 [IU]/L (ref 0–32)
AST: 20 [IU]/L (ref 0–40)
Albumin: 4.2 g/dL (ref 3.9–4.9)
Alkaline Phosphatase: 76 [IU]/L (ref 44–121)
BUN/Creatinine Ratio: 11 — ABNORMAL LOW (ref 12–28)
BUN: 10 mg/dL (ref 8–27)
Bilirubin Total: 0.3 mg/dL (ref 0.0–1.2)
CO2: 25 mmol/L (ref 20–29)
Calcium: 9.3 mg/dL (ref 8.7–10.3)
Chloride: 106 mmol/L (ref 96–106)
Creatinine, Ser: 0.9 mg/dL (ref 0.57–1.00)
Globulin, Total: 2.6 g/dL (ref 1.5–4.5)
Glucose: 84 mg/dL (ref 70–99)
Potassium: 4.4 mmol/L (ref 3.5–5.2)
Sodium: 144 mmol/L (ref 134–144)
Total Protein: 6.8 g/dL (ref 6.0–8.5)
eGFR: 70 mL/min/{1.73_m2}

## 2023-09-12 LAB — HEMOGLOBIN A1C
Est. average glucose Bld gHb Est-mCnc: 128 mg/dL
Hgb A1c MFr Bld: 6.1 % — ABNORMAL HIGH (ref 4.8–5.6)

## 2023-09-12 LAB — VITAMIN D 25 HYDROXY (VIT D DEFICIENCY, FRACTURES): Vit D, 25-Hydroxy: 39.3 ng/mL (ref 30.0–100.0)

## 2023-09-12 LAB — TSH: TSH: 2.6 u[IU]/mL (ref 0.450–4.500)

## 2023-09-20 ENCOUNTER — Other Ambulatory Visit: Payer: Self-pay | Admitting: Physician Assistant

## 2023-09-20 DIAGNOSIS — F419 Anxiety disorder, unspecified: Secondary | ICD-10-CM

## 2023-09-20 DIAGNOSIS — I10 Essential (primary) hypertension: Secondary | ICD-10-CM

## 2023-09-20 DIAGNOSIS — K219 Gastro-esophageal reflux disease without esophagitis: Secondary | ICD-10-CM

## 2023-10-15 ENCOUNTER — Encounter: Payer: Self-pay | Admitting: Physician Assistant

## 2023-11-05 ENCOUNTER — Encounter: Payer: Self-pay | Admitting: Physician Assistant

## 2023-11-06 ENCOUNTER — Other Ambulatory Visit: Payer: Self-pay | Admitting: Physician Assistant

## 2023-11-06 DIAGNOSIS — Z1211 Encounter for screening for malignant neoplasm of colon: Secondary | ICD-10-CM

## 2023-11-16 ENCOUNTER — Telehealth: Payer: Self-pay | Admitting: Gastroenterology

## 2023-11-16 NOTE — Telephone Encounter (Signed)
 Good morning Dr. Nandigam  The following patient has requested to become a patient. She needs to have a colonoscopy. She has been a patient of [facility] and no longer wants to continue there because it is too far and [facility] does not do procedures in [place]. Records are on Epic with care everywhere. Please review and advise of scheduling. Thank you.

## 2023-11-22 NOTE — Telephone Encounter (Signed)
 History of adenomatous colon polyps, due for surveillance colonoscopy in 2025.  Please schedule direct colonoscopy if patient meets criteria for [facility] and has no active GI symptoms.  Thanks

## 2023-11-26 ENCOUNTER — Encounter: Payer: Self-pay | Admitting: Gastroenterology

## 2023-12-20 ENCOUNTER — Other Ambulatory Visit: Payer: Self-pay | Admitting: Family Medicine

## 2023-12-20 DIAGNOSIS — F419 Anxiety disorder, unspecified: Secondary | ICD-10-CM

## 2023-12-20 DIAGNOSIS — K219 Gastro-esophageal reflux disease without esophagitis: Secondary | ICD-10-CM

## 2023-12-20 DIAGNOSIS — I10 Essential (primary) hypertension: Secondary | ICD-10-CM

## 2024-01-04 ENCOUNTER — Ambulatory Visit

## 2024-01-04 VITALS — Ht 65.0 in | Wt 177.0 lb

## 2024-01-04 DIAGNOSIS — Z860100 Personal history of colon polyps, unspecified: Secondary | ICD-10-CM

## 2024-01-04 MED ORDER — NA SULFATE-K SULFATE-MG SULF 17.5-3.13-1.6 GM/177ML PO SOLN
1.0000 | Freq: Once | ORAL | 0 refills | Status: AC
Start: 2024-01-04 — End: 2024-01-04

## 2024-01-04 NOTE — Progress Notes (Signed)
 No egg or soy allergy known to patient  No issues known to pt with past sedation with any surgeries or procedures Patient denies ever being told they had issues or difficulty with intubation  No FH of Malignant Hyperthermia Pt is not on Phentermine  (Qsymia ) - hold 10 days prior  Pt is not on  home 02  Pt is not on blood thinners  constipation occasionally taking OTC miralax  No A fib or A flutter Have any cardiac testing pending-- no  LOA: independent Prep: suprep  Patient's chart reviewed by Rogena Class CNRA prior to previsit and patient appropriate for the [facility].  Previsit completed and red dot placed by patient's name on their procedure day (on provider's schedule).     PV completed with patient. Prep instructions sent via mychart and home address.

## 2024-01-04 NOTE — Patient Instructions (Signed)
  Stop taking Qysmia (Phentermine ) - 10 days prior to your procedure last dose on before Sunday June 22 and then hold until after your procedure. Failure to hold this medication will result in a cancellation of your procedure.

## 2024-01-07 ENCOUNTER — Ambulatory Visit: Payer: Medicare Other

## 2024-01-08 ENCOUNTER — Ambulatory Visit (INDEPENDENT_AMBULATORY_CARE_PROVIDER_SITE_OTHER): Admitting: Family Medicine

## 2024-01-08 ENCOUNTER — Encounter: Payer: Self-pay | Admitting: Family Medicine

## 2024-01-08 ENCOUNTER — Ambulatory Visit: Payer: Self-pay

## 2024-01-08 VITALS — BP 118/60 | HR 68 | Temp 97.5°F | Resp 16 | Ht 65.0 in | Wt 179.0 lb

## 2024-01-08 DIAGNOSIS — H1012 Acute atopic conjunctivitis, left eye: Secondary | ICD-10-CM

## 2024-01-08 DIAGNOSIS — H01136 Eczematous dermatitis of left eye, unspecified eyelid: Secondary | ICD-10-CM

## 2024-01-08 HISTORY — DX: Acute atopic conjunctivitis, left eye: H10.12

## 2024-01-08 HISTORY — DX: Eczematous dermatitis of left eye, unspecified eyelid: H01.136

## 2024-01-08 MED ORDER — ZORYVE 0.15 % EX CREA: 1.0000 | TOPICAL_CREAM | Freq: Every day | CUTANEOUS | Status: DC

## 2024-01-08 MED ORDER — OLOPATADINE HCL 0.1 % OP SOLN: 1.0000 [drp] | Freq: Two times a day (BID) | OPHTHALMIC | 12 refills | Status: AC

## 2024-01-08 MED ORDER — PREDNISONE 50 MG PO TABS: 50.0000 mg | ORAL_TABLET | Freq: Every day | ORAL | 0 refills | Status: AC

## 2024-01-08 NOTE — Assessment & Plan Note (Signed)
 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 Excluded periorbital cellulitis.  - Provide Zoryve sample for application around the eye. - Prescribe prednisone  50 mg for three days. - Avoid steroid injection due to colonoscopy. - Consider post-colonoscopy steroid injection if symptoms persist.

## 2024-01-08 NOTE — Assessment & Plan Note (Signed)
 Itching on the inner canthus of the eye near the lacrimal sac. Patient has tried OTC drops with only slight improvement.  - Sent eyes drops to pharmacy for itching.

## 2024-01-08 NOTE — Progress Notes (Signed)
 Subjective:  Patient ID: Brittany Lawson, female    DOB: May 06, 1956  Age: 68 y.o. MRN: [id number]  Chief Complaint  Patient presents with   Eye Pain    Eczema     Discussed the use of AI scribe software for clinical note transcription with the patient, who gave verbal consent to proceed.  History of Present Illness   Brittany Lawson is a 68 year old female with eczema who presents with a swollen and painful eye.  She has been experiencing swelling and pain around her eye for the past week and a half. The pain is primarily in the eye, accompanied by itchiness inside the eye near the canthus. This condition has occurred before, and she has previously received a steroid injection and pills, which provided relief.  She is concerned about the potential impact of steroid treatment on her upcoming colonoscopy scheduled for next week. She has a history of eczema and notes that the current eye issue is similar to past episodes. She has been using drops for dry eyes but has not found relief for the itchiness.  Additionally, she reports a similar rash on her finger, which she suspects might be poison ivy, as she has a dog that she kisses and has had poison ivy in the past. No recent exposure to poison ivy. No pain with eye movement, and the pain is localized to the corner of her eye.         09/10/2023    2:26 PM 05/25/2023    9:30 AM 02/21/2023    8:53 AM 01/03/2023   10:04 AM 08/16/2022    9:19 AM  Depression screen PHQ 2/9  Decreased Interest 1 1 0 0 0  Down, Depressed, Hopeless 2 1 0 0 0  PHQ - 2 Score 3 2 0 0 0  Altered sleeping 2 1 0  0  Tired, decreased energy 2 1 0  0  Change in appetite 0 1 0  1  Feeling bad or failure about yourself  0 1 0  0  Trouble concentrating 0 0 0  0  Moving slowly or fidgety/restless 0 0 0  0  Suicidal thoughts 0 0 0  0  PHQ-9 Score 7 6 0  1  Difficult doing work/chores  Not difficult at all Not difficult at all  Not difficult at all        09/10/2023     2:26 PM  Fall Risk   Falls in the past year? 0  Number falls in past yr: 0  Injury with Fall? 0  Risk for fall due to : No Fall Risks  Follow up Falls evaluation completed    Patient Care Team: Nicholaus Credit, PA-C as PCP - [facility] [place] (Ophthalmology) Marda General, MD as Consulting Physician (Urology) Eloy Herring, MD as Consulting Physician (Gynecologic Oncology)   Review of Systems  Constitutional:  Negative for chills, fatigue and fever.  HENT:  Negative for congestion, ear pain and sore throat.   Respiratory:  Negative for cough and shortness of breath.   Cardiovascular:  Negative for chest pain and palpitations.  Gastrointestinal:  Negative for abdominal pain, constipation, diarrhea, nausea and vomiting.  Endocrine: Negative for polydipsia, polyphagia and polyuria.  Genitourinary:  Negative for difficulty urinating and dysuria.  Musculoskeletal:  Negative for arthralgias, back pain and myalgias.  Skin:  Negative for rash.  Neurological:  Negative for headaches.  Psychiatric/Behavioral:  Negative for dysphoric mood. The patient is not nervous/anxious.  Current Outpatient Medications on File Prior to Visit  Medication Sig Dispense Refill   albuterol  (VENTOLIN  HFA) 108 (90 Base) MCG/ACT inhaler INHALE 1 - 2 PUFFS EVERY 4 HOURS AS NEEDED (Patient taking differently: Inhale 1-2 puffs into the lungs every 4 (four) hours as needed.) 8.5 g 0   ALPRAZolam  (XANAX ) 0.5 MG tablet 1 po qd prn anxiety (Patient taking differently: Take 0.5 mg by mouth as needed. 1 po qd prn anxiety) 30 tablet 1   amLODipine  (NORVASC ) 5 MG tablet TAKE 1 TABLET BY MOUTH DAILY 90 tablet 0   fluticasone -salmeterol (ADVAIR) 250-50 MCG/ACT AEPB INHALE 1 PUFF INTO THE LUNGS IN THE MORNING AND AT BEDTIME. 60 each 2   ibuprofen  (ADVIL ) 800 MG tablet Take 1 tablet (800 mg total) by mouth every 8 (eight) hours as needed for moderate pain. For AFTER surgery only 30 tablet 0    losartan  (COZAAR ) 100 MG tablet TAKE 1 TABLET BY MOUTH AT BEDTIME. 90 tablet 0   meclizine (ANTIVERT) 25 MG tablet Take 25 mg by mouth 3 (three) times daily as needed for dizziness.     montelukast  (SINGULAIR ) 10 MG tablet TAKE 1 TABLET BY MOUTH DAILY 90 tablet 0   pantoprazole  (PROTONIX ) 40 MG tablet TAKE 1 TABLET BY MOUTH TWICE DAILY 180 tablet 0   Phentermine -Topiramate  ER (QSYMIA ) 7.5-46 MG CP24 Take 1 capsule by mouth daily.     pravastatin  (PRAVACHOL ) 20 MG tablet TAKE 1 TABLET BY MOUTH DAILY. 90 tablet 0   traZODone  (DESYREL ) 50 MG tablet TAKE 1 TABLET BY MOUTH AT BEDTIME 90 tablet 0   triamcinolone  ointment (KENALOG ) 0.1 % Apply topically.     venlafaxine  XR (EFFEXOR -XR) 75 MG 24 hr capsule TAKE 1 CAPSULE BY MOUTH TWO TIMES DAILY. 180 capsule 0   Current Facility-Administered Medications on File Prior to Visit  Medication Dose Route Frequency Provider Last Rate Last Admin   bupivacaine (PF) (MARCAINE ) 0.5 % 10 mL, triamcinolone  acetonide (KENALOG -40) 40 mg injection   Subcutaneous Once        Past Medical History:  Diagnosis Date   Asthma    Blood clot in vein yrs ago   x 2 right leg   Colon polyps 2019   Constipation    Depression    GERD (gastroesophageal reflux disease)    History of kidney stones 04-28-2020   passed on own   Hypertension    Left ovarian cyst    Varicose veins of both lower extremities    Past Surgical History:  Procedure Laterality Date   AUGMENTATION MAMMAPLASTY Bilateral age 81's age 2's     each redone 2018 right side calcification removed   BREAST EXCISIONAL BIOPSY Right    APPROX 20 YEARS AGO   CHOLECYSTECTOMY  age 49   [facility]   colonscopy  10-2019 and 05-2020   polyp removed both times due may 2022   ROBOTIC ASSISTED TOTAL HYSTERECTOMY WITH BILATERAL SALPINGO OOPHERECTOMY N/A 09/15/2020   Procedure: XI ROBOTIC ASSISTED TOTAL HYSTERECTOMY WITH BILATERAL SALPINGO OOPHORECTOMY;  Surgeon: Eloy Herring, MD;  Location: [facility];   Service: Gynecology;  Laterality: N/A;   TUBAL LIGATION  age 27   UPPER GI ENDOSCOPY  05/2020    Family History  Problem Relation Age of Onset   Breast cancer Mother 51   Colon cancer Neg Hx    Rectal cancer Neg Hx    Stomach cancer Neg Hx    Social History   Socioeconomic History   Marital status: Married  Spouse name: Afton   Number of children: 4   Years of education: 12   Highest education level: Not on file  Occupational History   Occupation: homemaker  Tobacco Use   Smoking status: Former    Current packs/day: 0.00    Average packs/day: 1.5 packs/day for 20.0 years (30.0 ttl pk-yrs)    Types: Cigarettes    Start date: 33    Quit date: 5    Years since quitting: 29.4   Smokeless tobacco: Never   Tobacco comments:    quit 25 yrs ago  Vaping Use   Vaping status: Never Used  Substance and Sexual Activity   Alcohol use: Not Currently   Drug use: Never   Sexual activity: Yes    Partners: Male  Other Topics Concern   Not on file  Social History Narrative   Not on file   Social Drivers of Health   Financial Resource Strain: Low Risk  (01/03/2023)   Overall Financial Resource Strain (CARDIA)    Difficulty of Paying Living Expenses: Not hard at all  Food Insecurity: No Food Insecurity (09/10/2023)   Hunger Vital Sign    Worried About Running Out of Food in the Last Year: Never true    Ran Out of Food in the Last Year: Never true  Transportation Needs: No Transportation Needs (09/10/2023)   PRAPARE - Administrator, Civil Service (Medical): No    Lack of Transportation (Non-Medical): No  Physical Activity: Insufficiently Active (09/10/2023)   Exercise Vital Sign    Days of Exercise per Week: 3 days    Minutes of Exercise per Session: 20 min  Stress: No Stress Concern Present (09/10/2023)   [employer] of Occupational Health - Occupational Stress Questionnaire    Feeling of Stress : Not at all  Social Connections: Moderately Isolated  (02/21/2023)   Social Connection and Isolation Panel    Frequency of Communication with Friends and Family: More than three times a week    Frequency of Social Gatherings with Friends and Family: Three times a week    Attends Religious Services: Never    Active Member of Clubs or Organizations: No    Attends Banker Meetings: Never    Marital Status: Married    Objective:  BP 118/60   Pulse 68   Temp (!) 97.5 F (36.4 C)   Resp 16   Ht 5' 5 (1.651 m)   Wt 179 lb (81.2 kg)   BMI 29.79 kg/m      01/08/2024   11:24 AM 01/04/2024    8:51 AM 09/10/2023    2:20 PM  BP/Weight  Systolic BP 118  871  Diastolic BP 60  64  Wt. (Lbs) 179 177 185  BMI 29.79 kg/m2 29.45 kg/m2 30.79 kg/m2    Physical Exam Vitals reviewed.  Constitutional:      General: She is not in acute distress.    Appearance: Normal appearance. She is not ill-appearing.   Eyes:     General: Lids are normal.     Comments: See photo  Neck:     Vascular: No carotid bruit.   Cardiovascular:     Rate and Rhythm: Normal rate and regular rhythm.     Heart sounds: Normal heart sounds. No murmur heard. Pulmonary:     Effort: Pulmonary effort is normal.     Breath sounds: Normal breath sounds. No wheezing.  Abdominal:     General: Bowel sounds are normal.  Neurological:     Mental Status: She is alert. Mental status is at baseline.   Psychiatric:        Mood and Affect: Mood normal.        Behavior: Behavior normal.      Lab Results  Component Value Date   WBC 8.1 09/11/2023   HGB 12.4 09/11/2023   HCT 37.7 09/11/2023   PLT 268 09/11/2023   GLUCOSE 84 09/11/2023   CHOL 177 09/11/2023   TRIG 88 09/11/2023   HDL 81 09/11/2023   LDLCALC 80 09/11/2023   ALT 18 09/11/2023   AST 20 09/11/2023   NA 144 09/11/2023   K 4.4 09/11/2023   CL 106 09/11/2023   CREATININE 0.90 09/11/2023   BUN 10 09/11/2023   CO2 25 09/11/2023   TSH 2.600 09/11/2023   HGBA1C 6.1 (H) 09/11/2023       Assessment & Plan:  Atopic dermatitis of eyelid, left Assessment & Plan: Periorbital Dermatitis Dermatological condition with eczema as a possible factor. Excluded periorbital cellulitis.  - Provide Zoryve sample for application around the eye. - Prescribe prednisone  50 mg for three days. - Avoid steroid injection due to colonoscopy. - Consider post-colonoscopy steroid injection if symptoms persist.  Orders: -     predniSONE ; Take 1 tablet (50 mg total) by mouth daily with breakfast for 3 days.  Dispense: 3 tablet; Refill: 0 -     Zoryve; Apply 1 Application topically daily.  Allergic conjunctivitis of left eye Assessment & Plan: Itching on the inner canthus of the eye near the lacrimal sac. Patient has tried OTC drops with only slight improvement.  - Sent eyes drops to pharmacy for itching.  Orders: -     Olopatadine HCl; Place 1 drop into the left eye 2 (two) times daily.  Dispense: 5 mL; Refill: 12          Meds ordered this encounter  Medications   predniSONE  (DELTASONE ) 50 MG tablet    Sig: Take 1 tablet (50 mg total) by mouth daily with breakfast for 3 days.    Dispense:  3 tablet    Refill:  0   Roflumilast (ZORYVE) 0.15 % CREA    Sig: Apply 1 Application topically daily.   olopatadine (PATANOL) 0.1 % ophthalmic solution    Sig: Place 1 drop into the left eye 2 (two) times daily.    Dispense:  5 mL    Refill:  12    No orders of the defined types were placed in this encounter.    Follow-up: Return if symptoms worsen or fail to improve.   LILLETTE Kato I Leal-Borjas,acting as a scribe for Harrie CHRISTELLA Cedar, FNP.,have documented all relevant documentation on the behalf of Harrie CHRISTELLA Cedar, FNP,as directed by  Harrie CHRISTELLA Cedar, FNP while in the presence of Harrie CHRISTELLA Cedar, FNP.   An After Visit Summary was printed and given to the patient.  I attest that I have reviewed this visit and agree with the plan scribed by my staff.   Harrie CHRISTELLA Cedar, FNP [facility] [phone]

## 2024-01-08 NOTE — Telephone Encounter (Signed)
  FYI Only or Action Required?: FYI only for provider.  Patient was last seen in primary care on 09/10/2023 by Nicholaus Credit, PA-C. Called Nurse Triage reporting Eye Pain. Symptoms began a week ago. Interventions attempted: Nothing. Symptoms are: unchanged.  Triage Disposition: See Physician Within 24 Hours  Patient/caregiver understands and will follow disposition?: Yes  **Pt. Scheduled for today 6/24**                      Copied from CRM #[id number]. Topic: Clinical - Red Word Triage >> Jan 08, 2024  9:43 AM Ivette P wrote: Red Word that prompted transfer to Nurse Triage: pt has Eczema around the eye, having pain and agravating Reason for Disposition  Eye pain present > 24 hours  Answer Assessment - Initial Assessment Questions 1. ONSET: When did the pain start? (e.g., minutes, hours, days)   Left eye, suspects eczema x 1 week   2. TIMING: Does the pain come and go, or has it been constant since it started? (e.g., constant, intermittent, fleeting)   Intermittent   3. SEVERITY: How bad is the pain?   (Scale 1-10; mild, moderate or severe)   - MILD (1-3): doesn't interfere with normal activities    - MODERATE (4-7): interferes with normal activities or awakens from sleep    - SEVERE (8-10): excruciating pain and patient unable to do normal activities     Mild     4. LOCATION: Where does it hurt?  (e.g., eyelid, eye, cheekbone)    Left eye 5. CAUSE: What do you think is causing the pain?     Suspects eczema  6. VISION: Do you have blurred vision or changes in your vision?      No  7. EYE DISCHARGE: Is there any discharge (pus) from the eye(s)?  If Yes, ask: What color is it?      No   8. FEVER: Do you have a fever? If Yes, ask: What is it, how was it measured, and when did it start?      No  9. OTHER SYMPTOMS: Do you have any other symptoms? (e.g., headache, nasal discharge, facial rash)     No  Protocols used: Eye Pain and Other  Symptoms-A-AH

## 2024-01-10 ENCOUNTER — Ambulatory Visit

## 2024-01-10 VITALS — Ht 65.0 in | Wt 179.0 lb

## 2024-01-10 DIAGNOSIS — Z0000 Encounter for general adult medical examination without abnormal findings: Secondary | ICD-10-CM | POA: Diagnosis not present

## 2024-01-10 NOTE — Patient Instructions (Signed)
 Brittany Lawson , Thank you for taking time out of your busy schedule to complete your Annual Wellness Visit with me. I enjoyed our conversation and look forward to speaking with you again next year. I, as well as your care team,  appreciate your ongoing commitment to your health goals. Please review the following plan we discussed and let me know if I can assist you in the future. Your Game plan/ To Do List   Follow up Visits: Next Medicare AWV with our clinical staff: In 1 year    Have you seen your provider in the last 6 months (3 months if uncontrolled diabetes)? Yes Next Office Visit with your provider: 03/10/24 @ 9:20  Clinician Recommendations:  Aim for 30 minutes of exercise or brisk walking, 6-8 glasses of water, and 5 servings of fruits and vegetables each day.       This is a list of the screening recommended for you and due dates:  Health Maintenance  Topic Date Due   Colon Cancer Screening  11/17/2023   Pneumococcal Vaccine for age over 30 (1 of 2 - PCV) 03/22/2024*   DEXA scan (bone density measurement)  05/24/2024*   DTaP/Tdap/Td vaccine (1 - Tdap) 09/09/2024*   Flu Shot  02/15/2024   Mammogram  04/03/2024   Medicare Annual Wellness Visit  01/09/2025   Hepatitis B Vaccine  Aged Out   HPV Vaccine  Aged Out   Meningitis B Vaccine  Aged Out   COVID-19 Vaccine  Discontinued   Hepatitis C Screening  Discontinued   Zoster (Shingles) Vaccine  Discontinued  *Topic was postponed. The date shown is not the original due date.    Advanced directives: (ACP Link)Information on Advanced Care Planning can be found at [place]  Secretary of [facility] Advance Health Care Directives Advance Health Care Directives. [email or web address]   Advance Care Planning is important because it:  [x]  Makes sure you receive the medical care that is consistent with your values, goals, and preferences  [x]  It provides guidance to your family and loved ones and reduces their decisional burden about whether or not  they are making the right decisions based on your wishes.  Follow the link provided in your after visit summary or read over the paperwork we have mailed to you to help you started getting your Advance Directives in place. If you need assistance in completing these, please reach out to us  so that we can help you!  See attachments for Preventive Care and Fall Prevention Tips.

## 2024-01-10 NOTE — Progress Notes (Signed)
 Subjective:   Brittany Lawson is a 68 y.o. who presents for a Medicare Wellness preventive visit.  As a reminder, Annual Wellness Visits don't include a physical exam, and some assessments may be limited, especially if this visit is performed virtually. We may recommend an in-person follow-up visit with your provider if needed.  Visit Complete: Virtual I connected with  [name] on 01/10/24 by a audio enabled telemedicine application and verified that I am speaking with the correct person using two identifiers.  Patient Location: Home  Provider Location: Home Office  I discussed the limitations of evaluation and management by telemedicine. The patient expressed understanding and agreed to proceed.  Vital Signs: Because this visit was a virtual/telehealth visit, some criteria may be missing or patient reported. Any vitals not documented were not able to be obtained and vitals that have been documented are patient reported.  VideoDeclined- This patient declined Librarian, academic. Therefore the visit was completed with audio only.  Persons Participating in Visit: Patient.  AWV Questionnaire: Yes: Patient Medicare AWV questionnaire was completed by the patient on 01/10/24; I have confirmed that all information answered by patient is correct and no changes since this date.  Cardiac Risk Factors include: advanced age (>47men, >64 women);dyslipidemia;hypertension     Objective:    Today's Vitals   01/10/24 1359  Weight: 179 lb (81.2 kg)  Height: 5' 5 (1.651 m)   Body mass index is 29.79 kg/m.     01/10/2024    2:04 PM 11/24/2020   10:03 AM 10/13/2020    3:00 PM 09/15/2020    6:55 AM 08/25/2020   10:01 AM  Advanced Directives  Does Patient Have a Medical Advance Directive? No No No  No  Would patient like information on creating a medical advance directive? Yes (MAU/Ambulatory/Procedural Areas - Information given) Yes (MAU/Ambulatory/Procedural  Areas - Information given) No - Patient declined No - Patient declined No - Patient declined    Current Medications (verified) Outpatient Encounter Medications as of 01/10/2024  Medication Sig   albuterol  (VENTOLIN  HFA) 108 (90 Base) MCG/ACT inhaler INHALE 1 - 2 PUFFS EVERY 4 HOURS AS NEEDED (Patient taking differently: Inhale 1-2 puffs into the lungs every 4 (four) hours as needed.)   ALPRAZolam  (XANAX ) 0.5 MG tablet 1 po qd prn anxiety (Patient taking differently: Take 0.5 mg by mouth as needed. 1 po qd prn anxiety)   amLODipine  (NORVASC ) 5 MG tablet TAKE 1 TABLET BY MOUTH DAILY   fluticasone -salmeterol (ADVAIR) 250-50 MCG/ACT AEPB INHALE 1 PUFF INTO THE LUNGS IN THE MORNING AND AT BEDTIME.   ibuprofen  (ADVIL ) 800 MG tablet Take 1 tablet (800 mg total) by mouth every 8 (eight) hours as needed for moderate pain. For AFTER surgery only   losartan  (COZAAR ) 100 MG tablet TAKE 1 TABLET BY MOUTH AT BEDTIME.   meclizine (ANTIVERT) 25 MG tablet Take 25 mg by mouth 3 (three) times daily as needed for dizziness.   montelukast  (SINGULAIR ) 10 MG tablet TAKE 1 TABLET BY MOUTH DAILY   olopatadine (PATANOL) 0.1 % ophthalmic solution Place 1 drop into the left eye 2 (two) times daily.   pantoprazole  (PROTONIX ) 40 MG tablet TAKE 1 TABLET BY MOUTH TWICE DAILY   Phentermine -Topiramate  ER (QSYMIA ) 7.5-46 MG CP24 Take 1 capsule by mouth daily.   pravastatin  (PRAVACHOL ) 20 MG tablet TAKE 1 TABLET BY MOUTH DAILY.   predniSONE  (DELTASONE ) 50 MG tablet Take 1 tablet (50 mg total) by mouth daily with breakfast for  3 days.   Roflumilast (ZORYVE) 0.15 % CREA Apply 1 Application topically daily.   traZODone  (DESYREL ) 50 MG tablet TAKE 1 TABLET BY MOUTH AT BEDTIME   triamcinolone  ointment (KENALOG ) 0.1 % Apply topically.   venlafaxine  XR (EFFEXOR -XR) 75 MG 24 hr capsule TAKE 1 CAPSULE BY MOUTH TWO TIMES DAILY.   Facility-Administered Encounter Medications as of 01/10/2024  Medication   bupivacaine (PF) (MARCAINE ) 0.5 %  10 mL, triamcinolone  acetonide (KENALOG -40) 40 mg injection    Allergies (verified) Sulfa antibiotics   History: Past Medical History:  Diagnosis Date   Allergy 1960   Anxiety 1980   Sometimes   Asthma    Blood clot in vein yrs ago   x 2 right leg   Colon polyps 2019   Constipation    Depression    GERD (gastroesophageal reflux disease)    History of kidney stones 2020/05/09   passed on own   Hypertension    Left ovarian cyst    Varicose veins of both lower extremities    Past Surgical History:  Procedure Laterality Date   ABDOMINAL HYSTERECTOMY  2022   AUGMENTATION MAMMAPLASTY Bilateral age 44's age 28's     each redone 2018 right side calcification removed   BREAST EXCISIONAL BIOPSY Right    APPROX 20 YEARS AGO   CHOLECYSTECTOMY  age 57   lapa   colonscopy  10-2019 and 05-2020   polyp removed both times due may 2022   EYE SURGERY     ROBOTIC ASSISTED TOTAL HYSTERECTOMY WITH BILATERAL SALPINGO OOPHERECTOMY N/A 09/15/2020   Procedure: XI ROBOTIC ASSISTED TOTAL HYSTERECTOMY WITH BILATERAL SALPINGO OOPHORECTOMY;  Surgeon: Eloy Herring, MD;  Location: [facility] [place];  Service: Gynecology;  Laterality: N/A;   TUBAL LIGATION  age 21   UPPER GI ENDOSCOPY  05/2020   Family History  Problem Relation Age of Onset   Breast cancer Mother 97   Cancer Mother    Early death Mother    Varicose Veins Mother    Hearing loss Maternal Grandfather    Colon cancer Neg Hx    Rectal cancer Neg Hx    Stomach cancer Neg Hx    Social History   Socioeconomic History   Marital status: Married    Spouse name: Afton   Number of children: 4   Years of education: 12   Highest education level: Some college, no degree  Occupational History   Occupation: homemaker  Tobacco Use   Smoking status: Former    Current packs/day: 0.00    Average packs/day: 1.5 packs/day for 20.0 years (30.0 ttl pk-yrs)    Types: Cigarettes    Start date: 4    Quit date: 1996    Years since  quitting: 29.5   Smokeless tobacco: Never   Tobacco comments:    quit 25 yrs ago  Vaping Use   Vaping status: Never Used  Substance and Sexual Activity   Alcohol use: Not Currently   Drug use: Never   Sexual activity: Yes    Partners: Male    Birth control/protection: Post-menopausal  Other Topics Concern   Not on file  Social History Narrative   Not on file   Social Drivers of Health   Financial Resource Strain: Low Risk  (01/10/2024)   Overall Financial Resource Strain (CARDIA)    Difficulty of Paying Living Expenses: Not very hard  Food Insecurity: No Food Insecurity (01/10/2024)   Hunger Vital Sign    Worried About Running Out of Food in  the Last Year: Never true    Ran Out of Food in the Last Year: Never true  Transportation Needs: No Transportation Needs (01/10/2024)   PRAPARE - Administrator, Civil Service (Medical): No    Lack of Transportation (Non-Medical): No  Physical Activity: Insufficiently Active (01/10/2024)   Exercise Vital Sign    Days of Exercise per Week: 4 days    Minutes of Exercise per Session: 20 min  Stress: No Stress Concern Present (01/10/2024)   [employer] of Occupational Health - Occupational Stress Questionnaire    Feeling of Stress: Only a little  Social Connections: Socially Integrated (01/10/2024)   Social Connection and Isolation Panel    Frequency of Communication with Friends and Family: More than three times a week    Frequency of Social Gatherings with Friends and Family: More than three times a week    Attends Religious Services: More than 4 times per year    Active Member of [employer] or Organizations: Yes    Attends Engineer, structural: More than 4 times per year    Marital Status: Married    Tobacco Counseling Counseling given: Not Answered Tobacco comments: quit 25 yrs ago    Clinical Intake:  Pre-visit preparation completed: Yes  Pain : No/denies pain     Diabetes: No  Lab Results  Component  Value Date   HGBA1C 6.1 (H) 09/11/2023   HGBA1C 6.1 (H) 02/21/2023   HGBA1C 6.2 (H) 08/16/2022     How often do you need to have someone help you when you read instructions, pamphlets, or other written materials from your doctor or pharmacy?: 1 - Never  Interpreter Needed?: No  Information entered by :: Charmaine Bloodgood LPN   Activities of Daily Living     01/10/2024    2:04 PM  In your present state of health, do you have any difficulty performing the following activities:  Hearing? 0  Vision? 0  Difficulty concentrating or making decisions? 0  Walking or climbing stairs? 0  Dressing or bathing? 0  Doing errands, shopping? 0  Preparing Food and eating ? N  Using the Toilet? N  In the past six months, have you accidently leaked urine? N  Do you have problems with loss of bowel control? N  Managing your Medications? N  Managing your Finances? N  Housekeeping or managing your Housekeeping? N    Patient Care Team: Nicholaus Credit, DEVONNA as PCP - [facility] [place] ([facility]) Marda General, MD as Consulting Physician (Urology) Eloy Herring, MD as Consulting Physician (Gynecologic Oncology) I have updated your Care Teams any recent Medical Services you may have received from other providers in the past year.     Assessment:   This is a routine wellness examination for Britaney.  Hearing/Vision screen Hearing Screening - Comments:: Denies hearing difficulties   Vision Screening - Comments:: No vision problems; will schedule routine eye exam soon    Goals Addressed             This Visit's Progress    Remain active and independent   On track      Depression Screen     01/10/2024    2:02 PM 09/10/2023    2:26 PM 05/25/2023    9:30 AM 02/21/2023    8:53 AM 01/03/2023   10:04 AM 08/16/2022    9:19 AM 02/06/2022    9:04 AM  PHQ 2/9 Scores  PHQ - 2 Score 3 3 2  0 0 0 3  PHQ- 9 Score 7 7 6  0  1 4    Fall Risk     01/10/2024    2:03  PM 09/10/2023    2:26 PM 08/30/2023    3:52 PM 05/25/2023    9:30 AM 02/21/2023    8:53 AM  Fall Risk   Falls in the past year? 0 0 0 0 0  Number falls in past yr: 0 0 0 0 0  Injury with Fall? 0 0 0 0 0  Risk for fall due to : No Fall Risks No Fall Risks No Fall Risks No Fall Risks No Fall Risks  Follow up Falls prevention discussed;Education provided;Falls evaluation completed Falls evaluation completed  Falls evaluation completed Falls evaluation completed    MEDICARE RISK AT HOME:  Medicare Risk at Home Any stairs in or around the home?: No If so, are there any without handrails?: No Home free of loose throw rugs in walkways, pet beds, electrical cords, etc?: Yes Adequate lighting in your home to reduce risk of falls?: Yes Life alert?: No Use of a cane, walker or w/c?: No Grab bars in the bathroom?: Yes Shower chair or bench in shower?: No Elevated toilet seat or a handicapped toilet?: Yes  TIMED UP AND GO:  Was the test performed?  No  Cognitive Function: Declined/Normal: No cognitive concerns noted by patient or family. Patient alert, oriented, able to answer questions appropriately and recall recent events. No signs of memory loss or confusion.        01/03/2023   10:14 AM 11/24/2020   10:17 AM  6CIT Screen  What Year? 0 points 0 points  What month? 0 points 0 points  What time? 0 points 0 points  Count back from 20 0 points 0 points  Months in reverse 0 points 0 points  Repeat phrase 0 points 0 points  Total Score 0 points 0 points    Immunizations Immunization History  Administered Date(s) Administered   Influenza-Unspecified 04/16/2012, 04/16/2016, 05/17/2018, 05/05/2020, 04/16/2021   Moderna Covid-19 Vaccine Bivalent Booster 72yrs & up 04/15/2021   Moderna Sars-Covid-2 Vaccination 09/12/2019, 10/15/2019, 06/20/2020   Zoster Recombinant(Shingrix) 03/01/2018   Zoster, Live 07/16/2014    Screening Tests Health Maintenance  Topic Date Due   Colonoscopy   11/17/2023   Pneumococcal Vaccine: 50+ Years (1 of 2 - PCV) 03/22/2024 (Originally 11/10/1974)   DEXA SCAN  05/24/2024 (Originally 04/22/2023)   DTaP/Tdap/Td (1 - Tdap) 09/09/2024 (Originally 11[birth year]10/1974)   INFLUENZA VACCINE  02/15/2024   MAMMOGRAM  04/03/2024   Medicare Annual Wellness (AWV)  01/09/2025   Hepatitis B Vaccines  Aged Out   HPV VACCINES  Aged Out   Meningococcal B Vaccine  Aged Out   COVID-19 Vaccine  Discontinued   Hepatitis C Screening  Discontinued   Zoster Vaccines- Shingrix  Discontinued    Health Maintenance  Health Maintenance Due  Topic Date Due   Colonoscopy  11/17/2023   Health Maintenance Items Addressed: Patient scheduled for colonoscopy on 01/17/24   Additional Screening:  Vision Screening: Recommended annual ophthalmology exams for early detection of glaucoma and other disorders of the eye. Would you like a referral to an eye doctor? No    Dental Screening: Recommended annual dental exams for proper oral hygiene  Community Resource Referral / Chronic Care Management: CRR required this visit?  No   CCM required this visit?  No   Plan:    I have personally reviewed and noted the following in  the patient's chart:   Medical and social history Use of alcohol, tobacco or illicit drugs  Current medications and supplements including opioid prescriptions. Patient is not currently taking opioid prescriptions. Functional ability and status Nutritional status Physical activity Advanced directives List of other physicians Hospitalizations, surgeries, and ER visits in previous 12 months Vitals Screenings to include cognitive, depression, and falls Referrals and appointments  In addition, I have reviewed and discussed with patient certain preventive protocols, quality metrics, and best practice recommendations. A written personalized care plan for preventive services as well as general preventive health recommendations were provided to  patient.   Lavelle Pfeiffer [place], [place]   [id number]   After Visit Summary: (MyChart) Due to this being a telephonic visit, the after visit summary with patients personalized plan was offered to patient via MyChart   Notes: Nothing significant to report at this time.

## 2024-01-17 ENCOUNTER — Ambulatory Visit: Admitting: Gastroenterology

## 2024-01-17 ENCOUNTER — Encounter: Payer: Self-pay | Admitting: Gastroenterology

## 2024-01-17 VITALS — BP 88/55 | HR 63 | Temp 97.7°F | Resp 23 | Ht 65.0 in | Wt 177.0 lb

## 2024-01-17 DIAGNOSIS — K648 Other hemorrhoids: Secondary | ICD-10-CM | POA: Diagnosis not present

## 2024-01-17 DIAGNOSIS — Z1211 Encounter for screening for malignant neoplasm of colon: Secondary | ICD-10-CM | POA: Diagnosis not present

## 2024-01-17 DIAGNOSIS — D123 Benign neoplasm of transverse colon: Secondary | ICD-10-CM | POA: Diagnosis not present

## 2024-01-17 DIAGNOSIS — K644 Residual hemorrhoidal skin tags: Secondary | ICD-10-CM

## 2024-01-17 DIAGNOSIS — Z860100 Personal history of colon polyps, unspecified: Secondary | ICD-10-CM

## 2024-01-17 MED ORDER — SODIUM CHLORIDE 0.9 % IV SOLN: 500.0000 mL | Freq: Once | INTRAVENOUS | Status: DC

## 2024-01-17 NOTE — Progress Notes (Signed)
 [facility] History and Physical   Primary Care Physician:  Nicholaus Credit, PA-C   Reason for Procedure:  History of adenomatous colon polyps  Plan:    Surveillance colonoscopy with possible interventions as needed     HPI: Brittany Lawson is a very pleasant 68 y.o. female here for surveillance colonoscopy. Denies any nausea, vomiting, abdominal pain, melena or bright red blood per rectum  The risks and benefits as well as alternatives of endoscopic procedure(s) have been discussed and reviewed. All questions answered. The patient agrees to proceed.    Past Medical History:  Diagnosis Date   Allergy 1960   Anxiety 1980   Sometimes   Asthma    Blood clot in vein yrs ago   x 2 right leg   Colon polyps 2019   Constipation    Depression    GERD (gastroesophageal reflux disease)    History of kidney stones 2020/03/24   passed on own   Hypertension    Left ovarian cyst    Varicose veins of both lower extremities     Past Surgical History:  Procedure Laterality Date   ABDOMINAL HYSTERECTOMY  2022   AUGMENTATION MAMMAPLASTY Bilateral age 4's age 10's     each redone 2018 right side calcification removed   BREAST EXCISIONAL BIOPSY Right    APPROX 20 YEARS AGO   CHOLECYSTECTOMY  age 28   lapa   colonscopy  10-2019 and 05-2020   polyp removed both times due may 2022   EYE SURGERY     ROBOTIC ASSISTED TOTAL HYSTERECTOMY WITH BILATERAL SALPINGO OOPHERECTOMY N/A 09/15/2020   Procedure: XI ROBOTIC ASSISTED TOTAL HYSTERECTOMY WITH BILATERAL SALPINGO OOPHORECTOMY;  Surgeon: Eloy Herring, MD;  Location: [facility] [place];  Service: Gynecology;  Laterality: N/A;   TUBAL LIGATION  age 68   UPPER GI ENDOSCOPY  05/2020    Prior to Admission medications   Medication Sig Start Date End Date Taking? Authorizing Provider  albuterol  (VENTOLIN  HFA) 108 (90 Base) MCG/ACT inhaler INHALE 1 - 2 PUFFS EVERY 4 HOURS AS NEEDED Patient taking differently: Inhale 1-2 puffs  into the lungs every 4 (four) hours as needed. 09/03/23   Nicholaus Credit, PA-C  ALPRAZolam  (XANAX ) 0.5 MG tablet 1 po qd prn anxiety Patient taking differently: Take 0.5 mg by mouth as needed. 1 po qd prn anxiety 12/08/19   Nicholaus Credit, PA-C  amLODipine  (NORVASC ) 5 MG tablet TAKE 1 TABLET BY MOUTH DAILY 12/20/23   Nicholaus Credit, PA-C  fluticasone -salmeterol (ADVAIR) 250-50 MCG/ACT AEPB INHALE 1 PUFF INTO THE LUNGS IN THE MORNING AND AT BEDTIME. 09/03/23   Nicholaus Credit, PA-C  ibuprofen  (ADVIL ) 800 MG tablet Take 1 tablet (800 mg total) by mouth every 8 (eight) hours as needed for moderate pain. For AFTER surgery only 08/26/20   Micheline Setter D, NP  losartan  (COZAAR ) 100 MG tablet TAKE 1 TABLET BY MOUTH AT BEDTIME. 12/20/23   Nicholaus Credit, PA-C  meclizine (ANTIVERT) 25 MG tablet Take 25 mg by mouth 3 (three) times daily as needed for dizziness.    [provider]  montelukast  (SINGULAIR ) 10 MG tablet TAKE 1 TABLET BY MOUTH DAILY 12/20/23   Nicholaus Credit, PA-C  olopatadine  (PATANOL) 0.1 % ophthalmic solution Place 1 drop into the left eye 2 (two) times daily. 01/08/24   Teressa Harrie HERO, FNP  pantoprazole  (PROTONIX ) 40 MG tablet TAKE 1 TABLET BY MOUTH TWICE DAILY 12/20/23   Nicholaus Credit, PA-C  Phentermine -Topiramate  ER (QSYMIA ) 7.5-46 MG CP24 Take 1  capsule by mouth daily.    [provider]  pravastatin  (PRAVACHOL ) 20 MG tablet TAKE 1 TABLET BY MOUTH DAILY. 12/20/23   Nicholaus Credit, PA-C  Roflumilast  (ZORYVE ) 0.15 % CREA Apply 1 Application topically daily. 01/08/24   Teressa Harrie HERO, FNP  traZODone  (DESYREL ) 50 MG tablet TAKE 1 TABLET BY MOUTH AT BEDTIME 12/20/23   Nicholaus Credit, PA-C  triamcinolone  ointment (KENALOG ) 0.1 % Apply topically. 02/15/23   [provider]  venlafaxine  XR (EFFEXOR -XR) 75 MG 24 hr capsule TAKE 1 CAPSULE BY MOUTH TWO TIMES DAILY. 12/20/23   Nicholaus Credit, PA-C    Current Outpatient Medications  Medication Sig Dispense Refill   albuterol  (VENTOLIN  HFA) 108 (90 Base) MCG/ACT inhaler  INHALE 1 - 2 PUFFS EVERY 4 HOURS AS NEEDED (Patient taking differently: Inhale 1-2 puffs into the lungs every 4 (four) hours as needed.) 8.5 g 0   ALPRAZolam  (XANAX ) 0.5 MG tablet 1 po qd prn anxiety (Patient taking differently: Take 0.5 mg by mouth as needed. 1 po qd prn anxiety) 30 tablet 1   amLODipine  (NORVASC ) 5 MG tablet TAKE 1 TABLET BY MOUTH DAILY 90 tablet 0   fluticasone -salmeterol (ADVAIR) 250-50 MCG/ACT AEPB INHALE 1 PUFF INTO THE LUNGS IN THE MORNING AND AT BEDTIME. 60 each 2   ibuprofen  (ADVIL ) 800 MG tablet Take 1 tablet (800 mg total) by mouth every 8 (eight) hours as needed for moderate pain. For AFTER surgery only 30 tablet 0   losartan  (COZAAR ) 100 MG tablet TAKE 1 TABLET BY MOUTH AT BEDTIME. 90 tablet 0   meclizine (ANTIVERT) 25 MG tablet Take 25 mg by mouth 3 (three) times daily as needed for dizziness.     montelukast  (SINGULAIR ) 10 MG tablet TAKE 1 TABLET BY MOUTH DAILY 90 tablet 0   olopatadine  (PATANOL) 0.1 % ophthalmic solution Place 1 drop into the left eye 2 (two) times daily. 5 mL 12   pantoprazole  (PROTONIX ) 40 MG tablet TAKE 1 TABLET BY MOUTH TWICE DAILY 180 tablet 0   Phentermine -Topiramate  ER (QSYMIA ) 7.5-46 MG CP24 Take 1 capsule by mouth daily.     pravastatin  (PRAVACHOL ) 20 MG tablet TAKE 1 TABLET BY MOUTH DAILY. 90 tablet 0   Roflumilast  (ZORYVE ) 0.15 % CREA Apply 1 Application topically daily.     traZODone  (DESYREL ) 50 MG tablet TAKE 1 TABLET BY MOUTH AT BEDTIME 90 tablet 0   triamcinolone  ointment (KENALOG ) 0.1 % Apply topically.     venlafaxine  XR (EFFEXOR -XR) 75 MG 24 hr capsule TAKE 1 CAPSULE BY MOUTH TWO TIMES DAILY. 180 capsule 0   Current Facility-Administered Medications  Medication Dose Route Frequency Provider Last Rate Last Admin   0.9 %  sodium chloride  infusion  500 mL Intravenous Once Dennison Mcdaid V, MD       bupivacaine (PF) (MARCAINE ) 0.5 % 10 mL, triamcinolone  acetonide (KENALOG -40) 40 mg injection   Subcutaneous Once          Allergies as of 01/17/2024 - Review Complete 01/17/2024  Allergen Reaction Noted   Sulfa antibiotics Nausea Only, Other (See Comments), and Nausea And Vomiting 08/20/2019    Family History  Problem Relation Age of Onset   Breast cancer Mother 28   Cancer Mother    Early death Mother    Varicose Veins Mother    Hearing loss Maternal Grandfather    Colon cancer Neg Hx    Rectal cancer Neg Hx    Stomach cancer Neg Hx     Social History   Socioeconomic History  Marital status: Married    Spouse name: Afton   Number of children: 4   Years of education: 12   Highest education level: Some college, no degree  Occupational History   Occupation: homemaker  Tobacco Use   Smoking status: Former    Current packs/day: 0.00    Average packs/day: 1.5 packs/day for 20.0 years (30.0 ttl pk-yrs)    Types: Cigarettes    Start date: 6    Quit date: 1996    Years since quitting: 29.5   Smokeless tobacco: Never   Tobacco comments:    quit 25 yrs ago  Vaping Use   Vaping status: Never Used  Substance and Sexual Activity   Alcohol use: Not Currently   Drug use: Never   Sexual activity: Yes    Partners: Male    Birth control/protection: Post-menopausal  Other Topics Concern   Not on file  Social History Narrative   Not on file   Social Drivers of Health   Financial Resource Strain: Low Risk  (01/10/2024)   Overall Financial Resource Strain (CARDIA)    Difficulty of Paying Living Expenses: Not very hard  Food Insecurity: No Food Insecurity (01/10/2024)   Hunger Vital Sign    Worried About Running Out of Food in the Last Year: Never true    Ran Out of Food in the Last Year: Never true  Transportation Needs: No Transportation Needs (01/10/2024)   PRAPARE - Administrator, Civil Service (Medical): No    Lack of Transportation (Non-Medical): No  Physical Activity: Insufficiently Active (01/10/2024)   Exercise Vital Sign    Days of Exercise per Week: 4 days     Minutes of Exercise per Session: 20 min  Stress: No Stress Concern Present (01/10/2024)   [employer] of Occupational Health - Occupational Stress Questionnaire    Feeling of Stress: Only a little  Social Connections: Socially Integrated (01/10/2024)   Social Connection and Isolation Panel    Frequency of Communication with Friends and Family: More than three times a week    Frequency of Social Gatherings with Friends and Family: More than three times a week    Attends Religious Services: More than 4 times per year    Active Member of [employer] or Organizations: Yes    Attends Engineer, structural: More than 4 times per year    Marital Status: Married  Catering manager Violence: Not At Risk (01/10/2024)   Humiliation, Afraid, Rape, and Kick questionnaire    Fear of Current or Ex-Partner: No    Emotionally Abused: No    Physically Abused: No    Sexually Abused: No    Review of Systems:  All other review of systems negative except as mentioned in the HPI.  Physical Exam: Vital signs in last 24 hours: BP (!) 142/60   Pulse 63   Temp 97.7 F (36.5 C)   Ht 5' 5 (1.651 m)   Wt 177 lb (80.3 kg)   SpO2 100%   BMI 29.45 kg/m  General:   Alert, NAD Lungs:  Clear .   Heart:  Regular rate and rhythm Abdomen:  Soft, nontender and nondistended. Neuro/Psych:  Alert and cooperative. Normal mood and affect. A and O x 3  Reviewed labs, radiology imaging, old records and pertinent past GI work up  Patient is appropriate for planned procedure(s) and anesthesia in an ambulatory setting   K. Veena Alexie Lanni , MD [phone]

## 2024-01-17 NOTE — Patient Instructions (Addendum)
-   Resume previous diet. - Continue present medications. - Await pathology results. - Repeat colonoscopy in 5 years for surveillance based on pathology results.  YOU HAD AN ENDOSCOPIC PROCEDURE TODAY AT THE [facility]:   Refer to the procedure report that was given to you for any specific questions about what was found during the examination.  If the procedure report does not answer your questions, please call your gastroenterologist to clarify.  If you requested that your care partner not be given the details of your procedure findings, then the procedure report has been included in a sealed envelope for you to review at your convenience later.  YOU SHOULD EXPECT: Some feelings of bloating in the abdomen. Passage of more gas than usual.  Walking can help get rid of the air that was put into your GI tract during the procedure and reduce the bloating. If you had a lower endoscopy (such as a colonoscopy or flexible sigmoidoscopy) you may notice spotting of blood in your stool or on the toilet paper. If you underwent a bowel prep for your procedure, you may not have a normal bowel movement for a few days.  Please Note:  You might notice some irritation and congestion in your nose or some drainage.  This is from the oxygen used during your procedure.  There is no need for concern and it should clear up in a day or so.  SYMPTOMS TO REPORT IMMEDIATELY:  Following lower endoscopy (colonoscopy or flexible sigmoidoscopy):  Excessive amounts of blood in the stool  Significant tenderness or worsening of abdominal pains  Swelling of the abdomen that is new, acute  Fever of 100F or higher  For urgent or emergent issues, a gastroenterologist can be reached at any hour by calling [phone]. Do not use MyChart messaging for urgent concerns.    DIET:  We do recommend a small meal at first, but then you may proceed to your regular diet.  Drink plenty of fluids but you should avoid alcoholic  beverages for 24 hours.  ACTIVITY:  You should plan to take it easy for the rest of today and you should NOT DRIVE or use heavy machinery until tomorrow (because of the sedation medicines used during the test).    FOLLOW UP: Our staff will call the number listed on your records the next business day following your procedure.  We will call around 7:15- 8:00 am to check on you and address any questions or concerns that you may have regarding the information given to you following your procedure. If we do not reach you, we will leave a message.     If any biopsies were taken you will be contacted by phone or by letter within the next 1-3 weeks.  Please call us at [phone] if you have not heard about the biopsies in 3 weeks.    SIGNATURES/CONFIDENTIALITY: You and/or your care partner have signed paperwork which will be entered into your electronic medical record.  These signatures attest to the fact that that the information above on your After Visit Summary has been reviewed and is understood.  Full responsibility of the confidentiality of this discharge information lies with you and/or your care-partner.

## 2024-01-17 NOTE — Progress Notes (Signed)
 Pt's states no medical or surgical changes since previsit or office visit.

## 2024-01-17 NOTE — Op Note (Signed)
 [facility] Patient Name: Brittany Lawson Procedure Date: 01/17/2024 10:54 AM MRN: [id number] Endoscopist: Gustav ALONSO Mcgee , MD, [id number] Age: 68 Referring MD:  Date of Birth: [birth year] Gender: Female Account #: [id number] Procedure:                Colonoscopy Indications:              High risk colon cancer surveillance: Personal                            history of colonic polyps, High risk colon cancer                            surveillance: Personal history of adenoma with                            villous component Medicines:                Monitored Anesthesia Care Procedure:                Pre-Anesthesia Assessment:                           - Prior to the procedure, a History and Physical                            was performed, and patient medications and                            allergies were reviewed. The patient's tolerance of                            previous anesthesia was also reviewed. The risks                            and benefits of the procedure and the sedation                            options and risks were discussed with the patient.                            All questions were answered, and informed consent                            was obtained. Prior Anticoagulants: The patient has                            taken no anticoagulant or antiplatelet agents. ASA                            Grade Assessment: II - A patient with mild systemic                            disease. After reviewing the risks and benefits,  the patient was deemed in satisfactory condition to                            undergo the procedure.                           After obtaining informed consent, the colonoscope                            was passed under direct vision. Throughout the                            procedure, the patient's blood pressure, pulse, and                            oxygen saturations were monitored  continuously. The                            PCF-HQ190L Colonoscope [id number] was introduced                            through the anus and advanced to the the cecum,                            identified by appendiceal orifice and ileocecal                            valve. The patient tolerated the procedure well.                            The quality of the bowel preparation was good. The                            ileocecal valve, appendiceal orifice, and rectum                            were photographed. Scope In: 11:10:51 AM Scope Out: 11:26:29 AM Scope Withdrawal Time: 0 hours 6 minutes 35 seconds  Total Procedure Duration: 0 hours 15 minutes 38 seconds  Findings:                 The perianal and digital rectal examinations were                            normal.                           A 5 mm polyp was found in the transverse colon. The                            polyp was sessile. The polyp was removed with a                            cold snare. Resection and retrieval were complete.  Non-bleeding external and internal hemorrhoids were                            found during retroflexion. The hemorrhoids were                            medium-sized. Complications:            No immediate complications. Estimated Blood Loss:     Estimated blood loss was minimal. Impression:               - One 5 mm polyp in the transverse colon, removed                            with a cold snare. Resected and retrieved.                           - Non-bleeding external and internal hemorrhoids. Recommendation:           - Patient has a contact number available for                            emergencies. The signs and symptoms of potential                            delayed complications were discussed with the                            patient. Return to normal activities tomorrow.                            Written discharge instructions were provided to the                             patient.                           - Resume previous diet.                           - Continue present medications.                           - Await pathology results.                           - Repeat colonoscopy in 5 years for surveillance                            based on pathology results. Kori Goins V. Mone Commisso, MD 01/17/2024 11:34:05 AM This report has been signed electronically.

## 2024-01-17 NOTE — Progress Notes (Signed)
 Report given to PACU, vss

## 2024-01-17 NOTE — Progress Notes (Signed)
 Called to room to assist during endoscopic procedure.  Patient ID and intended procedure confirmed with present staff. Received instructions for my participation in the procedure from the performing physician.

## 2024-01-21 ENCOUNTER — Telehealth: Payer: Self-pay

## 2024-01-21 NOTE — Telephone Encounter (Signed)
  Follow up Call-     01/17/2024   10:47 AM  Call back number  Post procedure Call Back phone  # [phone]  Permission to leave phone message Yes     Patient questions:  Do you have a fever, pain , or abdominal swelling? No. Pain Score  0 *  Have you tolerated food without any problems? Yes.    Have you been able to return to your normal activities? No.  Do you have any questions about your discharge instructions: Diet   No. Medications  No. Follow up visit  No.  Do you have questions or concerns about your Care? Yes.    Actions: * If pain score is 4 or above: Physician/ provider Notified : Gustav Mcgee, MD.  Patient c/o vertigo. States husband has to help her get OOB in mornings. Unable to return to normal activities. States this is a new problem. No vomiting, but does have nausea.

## 2024-01-23 ENCOUNTER — Encounter: Payer: Self-pay | Admitting: Physician Assistant

## 2024-01-23 LAB — SURGICAL PATHOLOGY

## 2024-01-24 ENCOUNTER — Other Ambulatory Visit: Payer: Self-pay | Admitting: Physician Assistant

## 2024-01-24 MED ORDER — DESONIDE 0.05 % EX CREA: TOPICAL_CREAM | Freq: Two times a day (BID) | CUTANEOUS | 0 refills | Status: AC

## 2024-01-28 ENCOUNTER — Encounter: Payer: Self-pay | Admitting: Gastroenterology

## 2024-02-11 ENCOUNTER — Ambulatory Visit: Admitting: Physician Assistant

## 2024-02-11 ENCOUNTER — Ambulatory Visit (INDEPENDENT_AMBULATORY_CARE_PROVIDER_SITE_OTHER): Admitting: Physician Assistant

## 2024-02-11 ENCOUNTER — Ambulatory Visit: Payer: Self-pay

## 2024-02-11 ENCOUNTER — Encounter: Payer: Self-pay | Admitting: Physician Assistant

## 2024-02-11 VITALS — BP 140/68 | HR 74 | Temp 97.2°F | Resp 16 | Ht 65.0 in | Wt 180.0 lb

## 2024-02-11 DIAGNOSIS — L308 Other specified dermatitis: Secondary | ICD-10-CM

## 2024-02-11 DIAGNOSIS — H0100B Unspecified blepharitis left eye, upper and lower eyelids: Secondary | ICD-10-CM | POA: Diagnosis not present

## 2024-02-11 MED ORDER — PREDNISONE 20 MG PO TABS: ORAL_TABLET | ORAL | 0 refills | Status: DC

## 2024-02-11 MED ORDER — DOXYCYCLINE HYCLATE 100 MG PO TABS: 100.0000 mg | ORAL_TABLET | Freq: Two times a day (BID) | ORAL | 0 refills | Status: DC

## 2024-02-11 MED ORDER — TRIAMCINOLONE ACETONIDE 40 MG/ML IJ SUSP
60.0000 mg | Freq: Once | INTRAMUSCULAR | Status: AC
  Administered 2024-02-11: 60 mg via INTRAMUSCULAR

## 2024-02-11 MED ORDER — ERYTHROMYCIN 5 MG/GM OP OINT: 1.0000 | TOPICAL_OINTMENT | Freq: Every day | OPHTHALMIC | 0 refills | Status: AC

## 2024-02-11 NOTE — Telephone Encounter (Signed)
 FYI Only or Action Required?: Action required by provider: request for appointment. Pt requesting AM appt today if available.  Patient was last seen in primary care on 01/08/2024 by Teressa Harrie HERO, FNP.  Called Nurse Triage reporting Blurred Vision.  Symptoms began several weeks ago.  Interventions attempted: Other: seen at [facility].  Symptoms are: gradually worsening.  Triage Disposition: See HCP Within 4 Hours (Or PCP Triage)  Patient/caregiver understands and will follow disposition?: Yes            Copied from CRM [id number]. Topic: Clinical - Red Word Triage >> Feb 11, 2024  7:38 AM Berwyn MATSU wrote: Red Word that prompted transfer to Nurse Triage: blurred vision in left eye Reason for Disposition  [1] Eye pain AND [2] brief (now gone) blurred vision or visual changes  Answer Assessment - Initial Assessment Questions 1. DESCRIPTION: How has your vision changed? (e.g., complete vision loss, blurred vision, double vision, floaters, etc.)     Blurry 2. LOCATION: One or both eyes? If one, ask: Which eye?     Left eye 3. SEVERITY: Can you see anything? If Yes, ask: What can you see? (e.g., fine print)     Yes 4. ONSET: When did this begin? Did it start suddenly or has this been gradual?     Worsening in the last 3 weeks 5. PATTERN: Does this come and go, or has it been constant since it started?     Constant, worsening 6. PAIN: Is there any pain in your eye(s)?  (Scale 1-10; or mild, moderate, severe)     Itching and uncomfortable 7. CONTACTS-GLASSES: Do you wear contacts or glasses?     Glasses 8. CAUSE: What do you think is causing this visual problem?     Unknown, pt has been seen for this  9. OTHER SYMPTOMS: Do you have any other symptoms? (e.g., confusion, headache, arm or leg weakness, speech problems)     None  Protocols used: Vision Loss or Change-A-AH

## 2024-02-11 NOTE — Progress Notes (Signed)
 Acute Office Visit  Subjective:    Patient ID: Brittany Lawson, female    DOB: Oct 18, 1955, 68 y.o.   MRN: [id number]  Chief Complaint  Patient presents with   Blurred Vision   Eczema    HPI: Patient is in today for complaints of rash around left eye and eyelid - left eye watery and draining - has had issues over the past month and has been treated by this office and urgent care.  States eye gets completely better then recurs.  Denies vision loss or change - only blurry at times because eye so watery  Pt also with eczema flare on arms and legs   Current Outpatient Medications:    albuterol  (VENTOLIN  HFA) 108 (90 Base) MCG/ACT inhaler, INHALE 1 - 2 PUFFS EVERY 4 HOURS AS NEEDED (Patient taking differently: Inhale 1-2 puffs into the lungs every 4 (four) hours as needed.), Disp: 8.5 g, Rfl: 0   ALPRAZolam  (XANAX ) 0.5 MG tablet, 1 po qd prn anxiety (Patient taking differently: Take 0.5 mg by mouth as needed for anxiety. 1 po qd prn anxiety), Disp: 30 tablet, Rfl: 1   amLODipine  (NORVASC ) 5 MG tablet, TAKE 1 TABLET BY MOUTH DAILY, Disp: 90 tablet, Rfl: 0   desonide  (DESOWEN ) 0.05 % cream, Apply topically 2 (two) times daily., Disp: 30 g, Rfl: 0   doxycycline  (VIBRA -TABS) 100 MG tablet, Take 1 tablet (100 mg total) by mouth 2 (two) times daily., Disp: 20 tablet, Rfl: 0   erythromycin  ophthalmic ointment, Place 1 Application into the left eye at bedtime for 5 days., Disp: 3.5 g, Rfl: 0   fluticasone -salmeterol (ADVAIR) 250-50 MCG/ACT AEPB, INHALE 1 PUFF INTO THE LUNGS IN THE MORNING AND AT BEDTIME., Disp: 60 each, Rfl: 2   gentamicin (GARAMYCIN) 0.3 % ophthalmic solution, SMARTSIG:In Eye(s), Disp: , Rfl:    ibuprofen  (ADVIL ) 800 MG tablet, Take 1 tablet (800 mg total) by mouth every 8 (eight) hours as needed for moderate pain. For AFTER surgery only, Disp: 30 tablet, Rfl: 0   losartan  (COZAAR ) 100 MG tablet, TAKE 1 TABLET BY MOUTH AT BEDTIME., Disp: 90 tablet, Rfl: 0   meclizine (ANTIVERT) 25  MG tablet, Take 25 mg by mouth 3 (three) times daily as needed for dizziness., Disp: , Rfl:    montelukast  (SINGULAIR ) 10 MG tablet, TAKE 1 TABLET BY MOUTH DAILY, Disp: 90 tablet, Rfl: 0   olopatadine  (PATANOL) 0.1 % ophthalmic solution, Place 1 drop into the left eye 2 (two) times daily., Disp: 5 mL, Rfl: 12   pantoprazole  (PROTONIX ) 40 MG tablet, TAKE 1 TABLET BY MOUTH TWICE DAILY, Disp: 180 tablet, Rfl: 0   Phentermine -Topiramate  ER (QSYMIA ) 7.5-46 MG CP24, Take 1 capsule by mouth daily., Disp: , Rfl:    pravastatin  (PRAVACHOL ) 20 MG tablet, TAKE 1 TABLET BY MOUTH DAILY., Disp: 90 tablet, Rfl: 0   predniSONE  (DELTASONE ) 20 MG tablet, 1 po tid for 3 days then 1 po bid for 3 days then 1 po qd for 3 days, Disp: 18 tablet, Rfl: 0   traZODone  (DESYREL ) 50 MG tablet, TAKE 1 TABLET BY MOUTH AT BEDTIME, Disp: 90 tablet, Rfl: 0   venlafaxine  XR (EFFEXOR -XR) 75 MG 24 hr capsule, TAKE 1 CAPSULE BY MOUTH TWO TIMES DAILY., Disp: 180 capsule, Rfl: 0  Current Facility-Administered Medications:    triamcinolone  acetonide (KENALOG -40) injection 60 mg, 60 mg, Intramuscular, Once,   Allergies  Allergen Reactions   Sulfa Antibiotics Nausea Only, Other (See Comments) and Nausea And Vomiting  Childhood reaction  Unknown reaction    ROS CONSTITUTIONAL: Negative for chills, fatigue, fever,  E/N/T: Negative for ear pain, nasal congestion and sore throat.  Eyes - see HPI CARDIOVASCULAR: Negative for chest pain, dizziness, palpitations and pedal edema.  RESPIRATORY: Negative for recent cough and dyspnea.  Skin - see HPI     Objective:    PHYSICAL EXAM:   BP (!) 140/68   Pulse 74   Temp (!) 97.2 F (36.2 C)   Resp 16   Ht 5' 5 (1.651 m)   Wt 180 lb (81.6 kg)   SpO2 98%   BMI 29.95 kg/m    GEN: Well nourished, well developed, in no acute distress  HEENT: normal external ears and nose - normal external auditory canals and TMS -- Lips, Teeth and Gums - normal  Eyes - right eye normal --- left  upper lid and lower lid and below red, inflamed and slight scaly skin - left eye with mild redness and watering Oropharynx - normal mucosa, palate, and posterior pharynx Cardiac: RRR; no murmurs,  Respiratory:  normal respiratory rate and pattern with no distress - normal breath sounds with no rales, rhonchi, wheezes or rubs      Assessment & Plan:    Blepharitis of both upper and lower eyelid of left eye, unspecified type -     Doxycycline  Hyclate; Take 1 tablet (100 mg total) by mouth 2 (two) times daily.  Dispense: 20 tablet; Refill: 0 -     predniSONE ; 1 po tid for 3 days then 1 po bid for 3 days then 1 po qd for 3 days  Dispense: 18 tablet; Refill: 0 -     Triamcinolone  Acetonide -     Erythromycin ; Place 1 Application into the left eye at bedtime for 5 days.  Dispense: 3.5 g; Refill: 0  Other eczema -     predniSONE ; 1 po tid for 3 days then 1 po bid for 3 days then 1 po qd for 3 days  Dispense: 18 tablet; Refill: 0 -     Triamcinolone  Acetonide     Follow-up: Return if symptoms worsen or fail to improve.  An After Visit Summary was printed and given to the patient.  CAMIE JONELLE NICHOLAUS DEVONNA [facility] [phone]

## 2024-02-11 NOTE — Telephone Encounter (Signed)
 Appointment made for 9:40

## 2024-03-10 ENCOUNTER — Ambulatory Visit (INDEPENDENT_AMBULATORY_CARE_PROVIDER_SITE_OTHER): Admitting: Physician Assistant

## 2024-03-10 ENCOUNTER — Encounter: Payer: Self-pay | Admitting: Physician Assistant

## 2024-03-10 ENCOUNTER — Ambulatory Visit: Payer: Medicare Other | Admitting: Physician Assistant

## 2024-03-10 VITALS — BP 150/84 | HR 77 | Temp 97.8°F | Resp 18 | Ht 65.0 in | Wt 181.6 lb

## 2024-03-10 DIAGNOSIS — I10 Essential (primary) hypertension: Secondary | ICD-10-CM | POA: Diagnosis not present

## 2024-03-10 DIAGNOSIS — R7303 Prediabetes: Secondary | ICD-10-CM

## 2024-03-10 DIAGNOSIS — K219 Gastro-esophageal reflux disease without esophagitis: Secondary | ICD-10-CM

## 2024-03-10 DIAGNOSIS — F419 Anxiety disorder, unspecified: Secondary | ICD-10-CM | POA: Diagnosis not present

## 2024-03-10 DIAGNOSIS — E782 Mixed hyperlipidemia: Secondary | ICD-10-CM

## 2024-03-10 DIAGNOSIS — L659 Nonscarring hair loss, unspecified: Secondary | ICD-10-CM

## 2024-03-10 DIAGNOSIS — E559 Vitamin D deficiency, unspecified: Secondary | ICD-10-CM

## 2024-03-10 NOTE — Progress Notes (Signed)
 Subjective:  Patient ID: Brittany Lawson, female    DOB: April 08, 1956  Age: 68 y.o. MRN: [id number]  Chief Complaint  Patient presents with   Medical Management of Chronic Issues    HPI Pt presents for follow up of hypertension.  The patient is tolerating the medication well without side effects. Compliance with treatment has been good; including taking medication as directed , maintains a healthy diet and regular exercise regimen , and following up as directed.she is taking norvasc  5mg  qd and losartan  100mg  qd Denies chest pain/sob/edema  Pt with anxiety - stable on current medications of xanax  prn and effexor  75mg  - she also takes trazodone  50mg  at bedtime for insomina  Pt with GERD - stable on protonix  40mg  qd  Mixed hyperlipidemia  Pt presents with hyperlipidemia. Compliance with treatment has been good -The patient is compliant with medications, maintains a low cholesterol diet , follows up as directed , and maintains an exercise regimen . The patient denies experiencing any hypercholesterolemia related symptoms. Taking pravachol  20mg  qd  Pt with asthma - symptoms stable on proair  and advair now ---   Pt complains of hair thinning - will check labwork       01/10/2024    2:02 PM 09/10/2023    2:26 PM 05/25/2023    9:30 AM 02/21/2023    8:53 AM 01/03/2023   10:04 AM  Depression screen PHQ 2/9  Decreased Interest 1 1 1  0 0  Down, Depressed, Hopeless 2 2 1  0 0  PHQ - 2 Score 3 3 2  0 0  Altered sleeping 2 2 1  0   Tired, decreased energy 2 2 1  0   Change in appetite 0 0 1 0   Feeling bad or failure about yourself  0 0 1 0   Trouble concentrating 0 0 0 0   Moving slowly or fidgety/restless 0 0 0 0   Suicidal thoughts  0 0 0   PHQ-9 Score 7 7 6  0   Difficult doing work/chores   Not difficult at all Not difficult at all         05/25/2023    9:30 AM 08/30/2023    3:52 PM 09/10/2023    2:26 PM 01/10/2024    2:03 PM 03/10/2024   11:23 AM  Fall Risk  Falls in the past year? 0 0  0 0 0  Was there an injury with Fall? 0 0 0 0 0  Fall Risk Category Calculator 0 0 0 0 0  Patient at Risk for Falls Due to No Fall Risks No Fall Risks No Fall Risks No Fall Risks No Fall Risks  Fall risk Follow up Falls evaluation completed  Falls evaluation completed Falls prevention discussed;Education provided;Falls evaluation completed Falls evaluation completed    CONSTITUTIONAL: Negative for chills, fatigue, fever, unintentional weight gain and unintentional weight loss.  E/N/T: Negative for ear pain, nasal congestion and sore throat.  CARDIOVASCULAR: Negative for chest pain, dizziness, palpitations and pedal edema.  RESPIRATORY: Negative for recent cough and dyspnea.  GASTROINTESTINAL: Negative for abdominal pain, acid reflux symptoms, constipation, diarrhea, nausea and vomiting.  MSK: Negative for arthralgias and myalgias.  INTEGUMENTARY:see HPI NEUROLOGICAL: Negative for dizziness and headaches.  PSYCHIATRIC: Negative for sleep disturbance and to question depression screen.  Negative for depression, negative for anhedonia.        Current Outpatient Medications:    albuterol  (VENTOLIN  HFA) 108 (90 Base) MCG/ACT inhaler, INHALE 1 - 2 PUFFS EVERY 4 HOURS AS NEEDED, Disp:  8.5 g, Rfl: 0   ALPRAZolam  (XANAX ) 0.5 MG tablet, 1 po qd prn anxiety, Disp: 30 tablet, Rfl: 1   amLODipine  (NORVASC ) 5 MG tablet, TAKE 1 TABLET BY MOUTH DAILY, Disp: 90 tablet, Rfl: 0   desonide  (DESOWEN ) 0.05 % cream, Apply topically 2 (two) times daily., Disp: 30 g, Rfl: 0   fluticasone -salmeterol (ADVAIR) 250-50 MCG/ACT AEPB, INHALE 1 PUFF INTO THE LUNGS IN THE MORNING AND AT BEDTIME., Disp: 60 each, Rfl: 2   ibuprofen  (ADVIL ) 800 MG tablet, Take 1 tablet (800 mg total) by mouth every 8 (eight) hours as needed for moderate pain. For AFTER surgery only, Disp: 30 tablet, Rfl: 0   losartan  (COZAAR ) 100 MG tablet, TAKE 1 TABLET BY MOUTH AT BEDTIME., Disp: 90 tablet, Rfl: 0   meclizine (ANTIVERT) 25 MG tablet, Take 25  mg by mouth 3 (three) times daily as needed for dizziness., Disp: , Rfl:    montelukast  (SINGULAIR ) 10 MG tablet, TAKE 1 TABLET BY MOUTH DAILY, Disp: 90 tablet, Rfl: 0   pantoprazole  (PROTONIX ) 40 MG tablet, TAKE 1 TABLET BY MOUTH TWICE DAILY, Disp: 180 tablet, Rfl: 0   pravastatin  (PRAVACHOL ) 20 MG tablet, TAKE 1 TABLET BY MOUTH DAILY., Disp: 90 tablet, Rfl: 0   traZODone  (DESYREL ) 50 MG tablet, TAKE 1 TABLET BY MOUTH AT BEDTIME, Disp: 90 tablet, Rfl: 0   venlafaxine  XR (EFFEXOR -XR) 75 MG 24 hr capsule, TAKE 1 CAPSULE BY MOUTH TWO TIMES DAILY., Disp: 180 capsule, Rfl: 0   olopatadine  (PATANOL) 0.1 % ophthalmic solution, Place 1 drop into the left eye 2 (two) times daily., Disp: 5 mL, Rfl: 12  Past Medical History:  Diagnosis Date   Allergy 1960   Anxiety 1980   Sometimes   Asthma    Blood clot in vein yrs ago   x 2 right leg   Colon polyps 04/01/2018   Constipation    Depression    GERD (gastroesophageal reflux disease)    History of kidney stones 2020/04/01   passed on own   Hypertension    Left ovarian cyst    Varicose veins of both lower extremities    Objective:  PHYSICAL EXAM:   VS: BP (!) 150/84   Pulse 77   Temp 97.8 F (36.6 C) (Temporal)   Resp 18   Ht 5' 5 (1.651 m)   Wt 181 lb 9.6 oz (82.4 kg)   BMI 30.22 kg/m   GEN: Well nourished, well developed, in no acute distress   Cardiac: RRR; no murmurs, rubs, or gallops,no edema - Respiratory:  normal respiratory rate and pattern with no distress - normal breath sounds with no rales, rhonchi, wheezes or rubs GI: normal bowel sounds, no masses or tenderness MS: no deformity or atrophy  Skin: warm and dry, no rash  Neuro:  Alert and Oriented x 3, - CN II-Xii grossly intact Psych: euthymic mood, appropriate affect and demeanor    Assessment & Plan:    Essential hypertension -     CBC with Differential/Platelet -     Comprehensive metabolic panel -     TSH Continue meds Recheck bp in 4 weeks nurse visit Mixed  hyperlipidemia -     Comprehensive metabolic panel -     Lipid panel Continue med - watch diet Vitamin D  insufficiency -     VITAMIN D  25 Hydroxy (Vit-D Deficiency, Fractures)  Gastroesophageal reflux disease without esophagitis Continue protonix  40mg  qd Prediabetes -     Hemoglobin A1c Watch diet  Mild  intermittent asthma Continue current meds  Hair thinning B12 and iron levels pending    Follow-up: Return in about 6 months (around 09/10/2024) for chronic fasting follow-up and in 4 weeks nurse visit bp check.  An After Visit Summary was printed and given to the patient.  CAMIE JONELLE NICHOLAUS DEVONNA [facility] [phone]

## 2024-03-11 ENCOUNTER — Ambulatory Visit: Payer: Self-pay | Admitting: Physician Assistant

## 2024-03-11 LAB — COMPREHENSIVE METABOLIC PANEL WITH GFR
ALT: 16 IU/L (ref 0–32)
AST: 18 IU/L (ref 0–40)
Albumin: 4.4 g/dL (ref 3.9–4.9)
Alkaline Phosphatase: 71 IU/L (ref 44–121)
BUN/Creatinine Ratio: 17 (ref 12–28)
BUN: 14 mg/dL (ref 8–27)
Bilirubin Total: 0.2 mg/dL (ref 0.0–1.2)
CO2: 19 mmol/L — ABNORMAL LOW (ref 20–29)
Calcium: 9.5 mg/dL (ref 8.7–10.3)
Chloride: 104 mmol/L (ref 96–106)
Creatinine, Ser: 0.82 mg/dL (ref 0.57–1.00)
Globulin, Total: 2.9 g/dL (ref 1.5–4.5)
Glucose: 86 mg/dL (ref 70–99)
Potassium: 4.3 mmol/L (ref 3.5–5.2)
Sodium: 142 mmol/L (ref 134–144)
Total Protein: 7.3 g/dL (ref 6.0–8.5)
eGFR: 77 mL/min/1.73 (ref >59)

## 2024-03-11 LAB — CBC WITH DIFFERENTIAL/PLATELET
Basophils Absolute: 0.1 x10E3/uL (ref 0.0–0.2)
Basos: 1 %
EOS (ABSOLUTE): 0.5 x10E3/uL — ABNORMAL HIGH (ref 0.0–0.4)
Eos: 6 %
Hematocrit: 41.3 % (ref 34.0–46.6)
Hemoglobin: 13.1 g/dL (ref 11.1–15.9)
Immature Grans (Abs): 0 x10E3/uL (ref 0.0–0.1)
Immature Granulocytes: 0 %
Lymphocytes Absolute: 1.8 x10E3/uL (ref 0.7–3.1)
Lymphs: 25 %
MCH: 27.2 pg (ref 26.6–33.0)
MCHC: 31.7 g/dL (ref 31.5–35.7)
MCV: 86 fL (ref 79–97)
Monocytes Absolute: 0.6 x10E3/uL (ref 0.1–0.9)
Monocytes: 8 %
Neutrophils Absolute: 4.5 x10E3/uL (ref 1.4–7.0)
Neutrophils: 60 %
Platelets: 253 x10E3/uL (ref 150–450)
RBC: 4.81 x10E6/uL (ref 3.77–5.28)
RDW: 13.2 % (ref 11.7–15.4)
WBC: 7.4 x10E3/uL (ref 3.4–10.8)

## 2024-03-11 LAB — VITAMIN D 25 HYDROXY (VIT D DEFICIENCY, FRACTURES): Vit D, 25-Hydroxy: 52.7 ng/mL (ref 30.0–100.0)

## 2024-03-11 LAB — IRON,TIBC AND FERRITIN PANEL
Ferritin: 32 ng/mL (ref 15–150)
Iron Saturation: 22 % (ref 15–55)
Iron: 79 ug/dL (ref 27–139)
Total Iron Binding Capacity: 366 ug/dL (ref 250–450)
UIBC: 287 ug/dL (ref 118–369)

## 2024-03-11 LAB — LIPID PANEL
Chol/HDL Ratio: 2.2 ratio (ref 0.0–4.4)
Cholesterol, Total: 195 mg/dL (ref 100–199)
HDL: 87 mg/dL (ref >39)
LDL Chol Calc (NIH): 85 mg/dL (ref 0–99)
Triglycerides: 134 mg/dL (ref 0–149)
VLDL Cholesterol Cal: 23 mg/dL (ref 5–40)

## 2024-03-11 LAB — HEMOGLOBIN A1C
Est. average glucose Bld gHb Est-mCnc: 126 mg/dL
Hgb A1c MFr Bld: 6 % — ABNORMAL HIGH (ref 4.8–5.6)

## 2024-03-11 LAB — TSH: TSH: 3.11 u[IU]/mL (ref 0.450–4.500)

## 2024-03-11 LAB — B12 AND FOLATE PANEL
Folate: 9.1 ng/mL (ref >3.0)
Vitamin B-12: 489 pg/mL (ref 232–1245)

## 2024-03-20 ENCOUNTER — Other Ambulatory Visit: Payer: Self-pay | Admitting: Physician Assistant

## 2024-03-20 DIAGNOSIS — F419 Anxiety disorder, unspecified: Secondary | ICD-10-CM

## 2024-03-20 DIAGNOSIS — I10 Essential (primary) hypertension: Secondary | ICD-10-CM

## 2024-03-24 ENCOUNTER — Other Ambulatory Visit: Payer: Self-pay | Admitting: Physician Assistant

## 2024-03-24 DIAGNOSIS — Z1231 Encounter for screening mammogram for malignant neoplasm of breast: Secondary | ICD-10-CM

## 2024-04-07 ENCOUNTER — Ambulatory Visit

## 2024-04-07 NOTE — Progress Notes (Signed)
 Patient came in today for 4 week blood pressure recheck. Blood pressure was checked and per Camie Moats, blood pressure was better than the last time and for the patient to keep eye on it at home and ensure that it is staying down.

## 2024-04-14 ENCOUNTER — Other Ambulatory Visit: Payer: Self-pay | Admitting: Physician Assistant

## 2024-04-14 ENCOUNTER — Ambulatory Visit
Admission: RE | Admit: 2024-04-14 | Discharge: 2024-04-14 | Disposition: A | Discharge status: Home / Self Care | Source: Ambulatory Visit | Attending: Physician Assistant | Admitting: Physician Assistant

## 2024-04-14 DIAGNOSIS — Z1231 Encounter for screening mammogram for malignant neoplasm of breast: Secondary | ICD-10-CM

## 2024-04-17 ENCOUNTER — Emergency Department (HOSPITAL_COMMUNITY)
Admission: EM | Admit: 2024-04-17 | Discharge: 2024-04-17 | Disposition: A | Discharge status: Home / Self Care | Attending: Emergency Medicine | Admitting: Emergency Medicine

## 2024-04-17 ENCOUNTER — Emergency Department (HOSPITAL_COMMUNITY)

## 2024-04-17 ENCOUNTER — Ambulatory Visit: Payer: Self-pay | Admitting: Family Medicine

## 2024-04-17 ENCOUNTER — Encounter (HOSPITAL_COMMUNITY): Payer: Self-pay

## 2024-04-17 ENCOUNTER — Telehealth: Payer: Self-pay

## 2024-04-17 ENCOUNTER — Other Ambulatory Visit: Payer: Self-pay

## 2024-04-17 DIAGNOSIS — I10 Essential (primary) hypertension: Secondary | ICD-10-CM | POA: Diagnosis not present

## 2024-04-17 DIAGNOSIS — Z79899 Other long term (current) drug therapy: Secondary | ICD-10-CM | POA: Insufficient documentation

## 2024-04-17 DIAGNOSIS — R079 Chest pain, unspecified: Secondary | ICD-10-CM

## 2024-04-17 DIAGNOSIS — R0789 Other chest pain: Secondary | ICD-10-CM | POA: Diagnosis present

## 2024-04-17 LAB — BASIC METABOLIC PANEL WITH GFR
Anion gap: 17 — ABNORMAL HIGH (ref 5–15)
BUN: 10 mg/dL (ref 8–23)
CO2: 21 mmol/L — ABNORMAL LOW (ref 22–32)
Calcium: 9.1 mg/dL (ref 8.9–10.3)
Chloride: 103 mmol/L (ref 98–111)
Creatinine, Ser: 0.83 mg/dL (ref 0.44–1.00)
GFR, Estimated: >60 mL/min (ref >60)
Glucose, Bld: 94 mg/dL (ref 70–99)
Potassium: 3.5 mmol/L (ref 3.5–5.1)
Sodium: 141 mmol/L (ref 135–145)

## 2024-04-17 LAB — CBC
HCT: 43.9 % (ref 36.0–46.0)
Hemoglobin: 14 g/dL (ref 12.0–15.0)
MCH: 27.6 pg (ref 26.0–34.0)
MCHC: 31.9 g/dL (ref 30.0–36.0)
MCV: 86.4 fL (ref 80.0–100.0)
Platelets: 270 K/uL (ref 150–400)
RBC: 5.08 MIL/uL (ref 3.87–5.11)
RDW: 13 % (ref 11.5–15.5)
WBC: 9.7 K/uL (ref 4.0–10.5)
nRBC: 0 % (ref 0.0–0.2)

## 2024-04-17 LAB — TROPONIN I (HIGH SENSITIVITY)
Troponin I (High Sensitivity): 4 ng/L (ref <18)
Troponin I (High Sensitivity): 5 ng/L (ref <18)

## 2024-04-17 MED ORDER — AMLODIPINE BESYLATE 5 MG PO TABS
5.0000 mg | ORAL_TABLET | Freq: Once | ORAL | Status: AC
  Administered 2024-04-17: 5 mg via ORAL
  Filled 2024-04-17: qty 1

## 2024-04-17 NOTE — ED Notes (Signed)
 PT discharged to home. PT educated on follow up. Breathing is even and unlabored. PT educated on medication instructions.

## 2024-04-17 NOTE — ED Triage Notes (Signed)
 Patient states she thinks something is going on with her heart. Saturday morning she had some fluttering in her chest and today she has mild central chest pain, left shoulder pain, and back pain. She has some dizziness, denies shob.

## 2024-04-17 NOTE — ED Provider Triage Note (Signed)
 Emergency Medicine Provider Triage Evaluation Note  CAYLEIGH PAULL , a 68 y.o. female  was evaluated in triage.  Pt complains of substernal chest discomfort as well as has pain between the shoulder blades that began approximately 4 days prior, she also describes a period of palpitations this previous Saturday that lasted for about 30 to 40 minutes.  Has no cardiac history, does have a history of blood pressure for which she does take losartan  and amlodipine .  Pain is described as persistent, not exacerbated with increased physical activity.  Denies having any shortness of breath.  Review of Systems  Positive: As above Negative:   Physical Exam  BP (!) 176/82 (BP Location: Left Arm)   Pulse 75   Temp 97.8 F (36.6 C)   Resp 17   Ht 5' 5 (1.651 m)   Wt 81.2 kg   SpO2 99%   BMI 29.79 kg/m  Gen:   Awake, no distress   Resp:  Normal effort  MSK:   Moves extremities without difficulty  Other:    Medical Decision Making  Medically screening exam initiated at 12:45 PM.  Appropriate orders placed.  NAESHA BUCKALEW was informed that the remainder of the evaluation will be completed by another provider, this initial triage assessment does not replace that evaluation, and the importance of remaining in the ED until their evaluation is complete.  Initial cardiac workup initiated.   Myriam Dorn BROCKS, [place] 04/17/24 1247

## 2024-04-17 NOTE — Discharge Instructions (Signed)
 As we discussed, your blood pressure is elevated and that is contributing to your symptoms  Please continue taking losartan  100 mg daily  Please increase your Norvasc  to 10 mg daily  Please follow-up with your doctor in a week to recheck your blood pressure.  I have referred you to cardiology for further testing if you have further chest pain  Return to ER if you have worse chest pain or shortness of breath

## 2024-04-17 NOTE — Telephone Encounter (Signed)
 Patient called states she woke up Saturday morning with her chest feeling funny, had a fluttering/electrical impulse feeling that lasted 30-40 minutes and then felt tired and continued to fall asleep again. Reports she had some pressure,upper back pain in between her shoulder blades. Felt funny again this morning. Per Ginnie, advised patient to go to the ED. Patient verbalized understanding and agreed to go.

## 2024-04-17 NOTE — ED Provider Notes (Signed)
 [place] EMERGENCY DEPARTMENT AT [facility] Provider Note   CSN: [id number] Arrival date & time: 04/17/24  1204     Patient presents with: No chief complaint on file.   Brittany Lawson is a 68 y.o. female history of hypertension, here presenting with chest pain.  Patient has left-sided chest pain radiate between the shoulder blades that is going on for the last for 5 days.  Patient states that she had some palpitations as well.  Patient denies any cardiac history.  Patient has history of hypertension and compliant with her losartan  and amlodipine    The history is provided by the patient.       Prior to Admission medications   Medication Sig Start Date End Date Taking? Authorizing Provider  albuterol  (VENTOLIN  HFA) 108 (90 Base) MCG/ACT inhaler INHALE 1 - 2 PUFFS EVERY 4 HOURS AS NEEDED 09/03/23   Nicholaus Credit, PA-C  ALPRAZolam  (XANAX ) 0.5 MG tablet 1 po qd prn anxiety 12/08/19   Nicholaus Credit, PA-C  amLODipine  (NORVASC ) 5 MG tablet TAKE 1 TABLET BY MOUTH DAILY 03/20/24   Nicholaus Credit, PA-C  desonide  (DESOWEN ) 0.05 % cream Apply topically 2 (two) times daily. 01/24/24   Nicholaus Credit, PA-C  fluticasone -salmeterol (ADVAIR) 250-50 MCG/ACT AEPB INHALE 1 PUFF INTO THE LUNGS IN THE MORNING AND AT BEDTIME. 09/03/23   Nicholaus Credit, PA-C  ibuprofen  (ADVIL ) 800 MG tablet Take 1 tablet (800 mg total) by mouth every 8 (eight) hours as needed for moderate pain. For AFTER surgery only 08/26/20   Cross, Melissa D, NP  losartan  (COZAAR ) 100 MG tablet TAKE 1 TABLET BY MOUTH AT BEDTIME. 03/20/24   Nicholaus Credit, PA-C  meclizine (ANTIVERT) 25 MG tablet Take 25 mg by mouth 3 (three) times daily as needed for dizziness.    [provider]  montelukast  (SINGULAIR ) 10 MG tablet TAKE 1 TABLET BY MOUTH DAILY 03/20/24   Nicholaus Credit, PA-C  olopatadine  (PATANOL) 0.1 % ophthalmic solution Place 1 drop into the left eye 2 (two) times daily. 01/08/24   Teressa Harrie HERO, FNP  pantoprazole  (PROTONIX ) 40 MG tablet TAKE  1 TABLET BY MOUTH TWICE DAILY 12/20/23   Nicholaus Credit, PA-C  pravastatin  (PRAVACHOL ) 20 MG tablet TAKE 1 TABLET BY MOUTH DAILY. 03/20/24   Nicholaus Credit, PA-C  traZODone  (DESYREL ) 50 MG tablet TAKE 1 TABLET BY MOUTH AT BEDTIME 03/20/24   Nicholaus Credit, PA-C  venlafaxine  XR (EFFEXOR -XR) 75 MG 24 hr capsule TAKE 1 CAPSULE BY MOUTH TWO TIMES DAILY. 03/20/24   Nicholaus Credit, PA-C    Allergies: Sulfa antibiotics    Review of Systems  Cardiovascular:  Positive for chest pain.  All other systems reviewed and are negative.   Updated Vital Signs BP (!) 186/80   Pulse 67   Temp 97.8 F (36.6 C) (Oral)   Resp 15   Ht 5' 5 (1.651 m)   Wt 81.2 kg   SpO2 100%   BMI 29.79 kg/m   Physical Exam Vitals and nursing note reviewed.  Constitutional:      Appearance: Normal appearance.  HENT:     Head: Normocephalic.     Nose: Nose normal.     Mouth/Throat:     Mouth: Mucous membranes are moist.  Eyes:     Extraocular Movements: Extraocular movements intact.     Pupils: Pupils are equal, round, and reactive to light.  Cardiovascular:     Rate and Rhythm: Normal rate and regular rhythm.     Pulses: Normal pulses.  Heart sounds: Normal heart sounds.  Pulmonary:     Effort: Pulmonary effort is normal.     Breath sounds: Normal breath sounds.  Abdominal:     General: Abdomen is flat.     Palpations: Abdomen is soft.  Musculoskeletal:        General: Normal range of motion.     Cervical back: Normal range of motion and neck supple.  Skin:    General: Skin is warm.     Capillary Refill: Capillary refill takes less than 2 seconds.  Neurological:     General: No focal deficit present.     Mental Status: She is alert and oriented to person, place, and time.  Psychiatric:        Mood and Affect: Mood normal.        Behavior: Behavior normal.     (all labs ordered are listed, but only abnormal results are displayed) Labs Reviewed  BASIC METABOLIC PANEL WITH GFR - Abnormal; Notable for the  following components:      Result Value   CO2 21 (*)    Anion gap 17 (*)    All other components within normal limits  CBC  TROPONIN I (HIGH SENSITIVITY)  TROPONIN I (HIGH SENSITIVITY)    EKG: EKG Interpretation Date/Time:  Thursday April 17 2024 12:14:21 EDT Ventricular Rate:  73 PR Interval:  126 QRS Duration:  90 QT Interval:  408 QTC Calculation: 449 R Axis:   -28  Text Interpretation: Normal sinus rhythm Normal ECG When compared with ECG of 13-Sep-2020 09:27, PREVIOUS ECG IS PRESENT No significant change since last tracing Confirmed by Patt Alm DEL [id number]) on 04/17/2024 4:26:37 PM  Radiology: DG Chest 2 View Result Date: 04/17/2024 CLINICAL DATA:  Chest pain. EXAM: CHEST - 2 VIEW COMPARISON:  08/30/2023. FINDINGS: The heart size and mediastinal contours are within normal limits. No focal consolidation, pleural effusion, or pneumothorax. No acute osseous abnormality. IMPRESSION: No acute cardiopulmonary findings. Electronically Signed   By: Harrietta Sherry M.D.   On: 04/17/2024 13:26     Procedures   Medications Ordered in the ED  amLODipine  (NORVASC ) tablet 5 mg (5 mg Oral Given 04/17/24 1648)                                    Medical Decision Making Brittany Lawson is a 68 y.o. female here presenting with chest pain and hypertension.  Patient likely has symptomatic hypertension.  I have low suspicion for dissection.  I also doubt PE and patient low risk for ACS.  Plan to get CBC and BMP and troponin x 2.  Will also give extra dose of norvasc   5:08 PM I have reviewed patient's labs and troponin negative x 2.  Chest x-ray is clear.  Stable for discharge.  Will increase Norvasc  to 10 mg daily and have him continue with losartan      Amount and/or Complexity of Data Reviewed Labs: ordered. Radiology: ordered.  Risk Prescription drug management.     Final diagnoses:  Hypertension, unspecified type  Chest pain, unspecified type    ED Discharge Orders           Ordered    Ambulatory referral to Cardiology        04/17/24 1702               Patt Alm Macho, MD 04/17/24 [phone]

## 2024-04-21 ENCOUNTER — Encounter: Payer: Self-pay | Admitting: Physician Assistant

## 2024-04-30 ENCOUNTER — Ambulatory Visit (INDEPENDENT_AMBULATORY_CARE_PROVIDER_SITE_OTHER): Admitting: Physician Assistant

## 2024-04-30 ENCOUNTER — Encounter: Payer: Self-pay | Admitting: Physician Assistant

## 2024-04-30 VITALS — BP 150/68 | HR 90 | Temp 98.2°F | Ht 65.0 in | Wt 185.2 lb

## 2024-04-30 DIAGNOSIS — I10 Essential (primary) hypertension: Secondary | ICD-10-CM

## 2024-04-30 DIAGNOSIS — R002 Palpitations: Secondary | ICD-10-CM

## 2024-04-30 DIAGNOSIS — F419 Anxiety disorder, unspecified: Secondary | ICD-10-CM

## 2024-04-30 DIAGNOSIS — R0789 Other chest pain: Secondary | ICD-10-CM

## 2024-04-30 MED ORDER — METOPROLOL SUCCINATE ER 25 MG PO TB24: 25.0000 mg | ORAL_TABLET | Freq: Every day | ORAL | 1 refills | Status: DC

## 2024-04-30 MED ORDER — ALPRAZOLAM 0.5 MG PO TABS: ORAL_TABLET | ORAL | 1 refills | Status: DC

## 2024-04-30 NOTE — Progress Notes (Signed)
 Subjective:  Patient ID: Brittany Lawson, female    DOB: [birth year]  Age: 68 y.o. MRN: [id number]  Chief Complaint  Patient presents with   ED follow up Chest pain    HPI Pt states that on 04/12/24 she woke up in the night with a sensation of 'trembling' in her anterior chest - describes as fluttering along with some chest pressure and pain that radiated to her back.  She states the episode was for about 40 minutes.  She did not seek care that day but continued with normal activities.  She continued to have chest pressure and occasional palpitations and then called office on 04/17/24 and advised to go to ED.  She did go to [facility] ED and was found to have elevated blood pressure.  Serial troponins were negative , chest xray negative and EKG negative. CBC and bmp were normal .  Recent TSH was normal.   Was diagnosed with symptomatic hypertension and discharged on her current bp med of losartan  100mg  qd but increased norvasc  to 10mg  qd.   She states bp at home has been elevated 150s/160s/60-80.  She is not having any further chest pain since last week but is continuing to have occasional palpitations.  Denies dyspnea  Pt requests refill of xanax  for her anxiety - states she does feel anxious about her current cardiac symptoms     04/30/2024    2:05 PM 01/10/2024    2:02 PM 09/10/2023    2:26 PM 05/25/2023    9:30 AM 02/21/2023    8:53 AM  Depression screen PHQ 2/9  Decreased Interest 0 1 1 1  0  Down, Depressed, Hopeless 0 2 2 1  0  PHQ - 2 Score 0 3 3 2  0  Altered sleeping  2 2 1  0  Tired, decreased energy  2 2 1  0  Change in appetite  0 0 1 0  Feeling bad or failure about yourself   0 0 1 0  Trouble concentrating  0 0 0 0  Moving slowly or fidgety/restless  0 0 0 0  Suicidal thoughts   0 0 0  PHQ-9 Score  7 7 6  0  Difficult doing work/chores    Not difficult at all Not difficult at all        08/30/2023    3:52 PM 09/10/2023    2:26 PM 01/10/2024    2:03 PM 03/10/2024   11:23 AM 04/30/2024     2:05 PM  Fall Risk  Falls in the past year? 0 0 0 0 0  Was there an injury with Fall? 0 0 0 0 0  Fall Risk Category Calculator 0 0 0 0 0  Patient at Risk for Falls Due to No Fall Risks No Fall Risks No Fall Risks No Fall Risks No Fall Risks  Fall risk Follow up  Falls evaluation completed Falls prevention discussed;Education provided;Falls evaluation completed Falls evaluation completed Falls evaluation completed     ROS CONSTITUTIONAL: malaise/fatigue E/N/T: Negative for ear pain, nasal congestion and sore throat.  CARDIOVASCULAR: see HPI RESPIRATORY: Negative for recent cough and dyspnea.  GASTROINTESTINAL: Negative for abdominal pain, acid reflux symptoms, constipation, diarrhea, nausea and vomiting.  MSK: Negative for arthralgias and myalgias.  INTEGUMENTARY: Negative for rash.  NEUROLOGICAL: Negative for dizziness and headaches.  PSYCHIATRIC: see HPI   Current Outpatient Medications:    albuterol  (VENTOLIN  HFA) 108 (90 Base) MCG/ACT inhaler, INHALE 1 - 2 PUFFS EVERY 4 HOURS AS NEEDED, Disp: 8.5 g, Rfl:  0   amLODipine  (NORVASC ) 10 MG tablet, Take 10 mg by mouth daily., Disp: , Rfl:    desonide  (DESOWEN ) 0.05 % cream, Apply topically 2 (two) times daily., Disp: 30 g, Rfl: 0   fluticasone -salmeterol (ADVAIR) 250-50 MCG/ACT AEPB, INHALE 1 PUFF INTO THE LUNGS IN THE MORNING AND AT BEDTIME., Disp: 60 each, Rfl: 2   ibuprofen  (ADVIL ) 800 MG tablet, Take 1 tablet (800 mg total) by mouth every 8 (eight) hours as needed for moderate pain. For AFTER surgery only, Disp: 30 tablet, Rfl: 0   losartan  (COZAAR ) 100 MG tablet, TAKE 1 TABLET BY MOUTH AT BEDTIME., Disp: 90 tablet, Rfl: 0   meclizine (ANTIVERT) 25 MG tablet, Take 25 mg by mouth 3 (three) times daily as needed for dizziness., Disp: , Rfl:    metoprolol succinate (TOPROL-XL) 25 MG 24 hr tablet, Take 1 tablet (25 mg total) by mouth daily., Disp: 30 tablet, Rfl: 1   montelukast  (SINGULAIR ) 10 MG tablet, TAKE 1 TABLET BY MOUTH DAILY,  Disp: 90 tablet, Rfl: 0   olopatadine  (PATANOL) 0.1 % ophthalmic solution, Place 1 drop into the left eye 2 (two) times daily., Disp: 5 mL, Rfl: 12   pantoprazole  (PROTONIX ) 40 MG tablet, TAKE 1 TABLET BY MOUTH TWICE DAILY, Disp: 180 tablet, Rfl: 0   pravastatin  (PRAVACHOL ) 20 MG tablet, TAKE 1 TABLET BY MOUTH DAILY., Disp: 90 tablet, Rfl: 0   traZODone  (DESYREL ) 50 MG tablet, TAKE 1 TABLET BY MOUTH AT BEDTIME, Disp: 90 tablet, Rfl: 0   venlafaxine  XR (EFFEXOR -XR) 75 MG 24 hr capsule, TAKE 1 CAPSULE BY MOUTH TWO TIMES DAILY., Disp: 180 capsule, Rfl: 0   ALPRAZolam  (XANAX ) 0.5 MG tablet, 1 po qd prn anxiety, Disp: 30 tablet, Rfl: 1  Past Medical History:  Diagnosis Date   Allergy 1960   Anxiety 1980   Sometimes   Asthma    Blood clot in vein yrs ago   x 2 right leg   Colon polyps 2019   Constipation    Depression    GERD (gastroesophageal reflux disease)    History of kidney stones 04/30/20   passed on own   Hypertension    Left ovarian cyst    Varicose veins of both lower extremities    Objective:  PHYSICAL EXAM:   BP (!) 150/68   Pulse 90   Temp 98.2 F (36.8 C)   Ht 5' 5 (1.651 m)   Wt 185 lb 3.2 oz (84 kg)   SpO2 98%   BMI 30.82 kg/m    GEN: Well nourished, well developed, in no acute distress  Cardiac: RRR; no murmurs, rubs, or gallops,no edema - Respiratory:  normal respiratory rate and pattern with no distress - normal breath sounds with no rales, rhonchi, wheezes or rubs GI: normal bowel sounds, no masses or tenderness  Skin: warm and dry, no rash   Psych: euthymic mood, appropriate affect and demeanor EKG - no acute changes Assessment & Plan:    Palpitations -     EKG 12-Lead -     Ambulatory referral to Cardiology -     Metoprolol Succinate ER; Take 1 tablet (25 mg total) by mouth daily.  Dispense: 30 tablet; Refill: 1  Other chest pain -     EKG 12-Lead -     Ambulatory referral to Cardiology -     Metoprolol Succinate ER; Take 1 tablet (25 mg  total) by mouth daily.  Dispense: 30 tablet; Refill: 1  Anxiety -  ALPRAZolam ; 1 po qd prn anxiety  Dispense: 30 tablet; Refill: 1  Essential hypertension -     Ambulatory referral to Cardiology -     Metoprolol Succinate ER; Take 1 tablet (25 mg total) by mouth daily.  Dispense: 30 tablet; Refill: 1     Follow-up: Return in about 4 weeks (around 05/28/2024) for follow-up.  An After Visit Summary was printed and given to the patient.  CAMIE JONELLE NICHOLAUS DEVONNA [facility] [phone]

## 2024-05-01 ENCOUNTER — Ambulatory Visit

## 2024-05-01 DIAGNOSIS — I8290 Acute embolism and thrombosis of unspecified vein: Secondary | ICD-10-CM | POA: Insufficient documentation

## 2024-05-01 DIAGNOSIS — K219 Gastro-esophageal reflux disease without esophagitis: Secondary | ICD-10-CM | POA: Insufficient documentation

## 2024-05-01 DIAGNOSIS — I8393 Asymptomatic varicose veins of bilateral lower extremities: Secondary | ICD-10-CM | POA: Insufficient documentation

## 2024-05-01 DIAGNOSIS — F32A Depression, unspecified: Secondary | ICD-10-CM | POA: Insufficient documentation

## 2024-05-01 DIAGNOSIS — K5900 Constipation, unspecified: Secondary | ICD-10-CM | POA: Insufficient documentation

## 2024-05-01 DIAGNOSIS — J45909 Unspecified asthma, uncomplicated: Secondary | ICD-10-CM | POA: Insufficient documentation

## 2024-05-01 DIAGNOSIS — T7840XA Allergy, unspecified, initial encounter: Secondary | ICD-10-CM | POA: Insufficient documentation

## 2024-05-01 DIAGNOSIS — I10 Essential (primary) hypertension: Secondary | ICD-10-CM | POA: Insufficient documentation

## 2024-05-02 ENCOUNTER — Ambulatory Visit

## 2024-05-05 ENCOUNTER — Ambulatory Visit: Attending: Cardiology | Admitting: Cardiology

## 2024-05-05 ENCOUNTER — Encounter: Payer: Self-pay | Admitting: Cardiology

## 2024-05-05 VITALS — BP 127/66 | HR 60 | Resp 16 | Ht 65.0 in | Wt 180.0 lb

## 2024-05-05 DIAGNOSIS — E7800 Pure hypercholesterolemia, unspecified: Secondary | ICD-10-CM | POA: Diagnosis not present

## 2024-05-05 DIAGNOSIS — R002 Palpitations: Secondary | ICD-10-CM | POA: Diagnosis not present

## 2024-05-05 DIAGNOSIS — I10 Essential (primary) hypertension: Secondary | ICD-10-CM | POA: Insufficient documentation

## 2024-05-05 DIAGNOSIS — R0789 Other chest pain: Secondary | ICD-10-CM | POA: Insufficient documentation

## 2024-05-05 MED ORDER — ATORVASTATIN CALCIUM 10 MG PO TABS: 10.0000 mg | ORAL_TABLET | Freq: Every day | ORAL | 0 refills | Status: DC

## 2024-05-05 NOTE — Patient Instructions (Signed)
PAC = Premature atrial complexes: These arise from the upper chamber of the heart.  These are very common and are not dangerous.  Extra skipped beat coming from the upper chamber (atrium) and mostly are life altering (nuisance) than life threatening and mostly treated by reassurance.  There may not be any specific reasons for this, however patients with excessive caffeine, anxiety, lack of sleep, alcohol or thyroid problems can have these episodes.   Premature ventricular complexes (PVC) means extra heartbeat from the lower chamber of the heart.  These are very common and are not dangerous.  Extra skipped beat coming from the bottom chamber (ventricle) and mostly are life altering (nuisance) than life threatening and mostly treated by reassurance.  There may not be any specific reasons for this, however patients with excessive caffeine, anxiety, lack of sleep, alcohol or thyroid problems can have these episodes.  Rarely patients with block coronary arteries or family history of heart muscle disease may be the reason.  If more questions, he can discuss with her doctor on office visit.  

## 2024-05-05 NOTE — Progress Notes (Signed)
 Cardiology Office Note:  .   Date:  05/05/2024  ID:  Brittany Lawson, DOB 03-30-1956, MRN [id number] PCP: Nicholaus Credit, PA-C  [facility] Providers Cardiologist:  None   History of Present Illness: .   Brittany Lawson is a 68 y.o. referred to me for evaluation of chest discomfort and palpitations and accompanied by her husband.  She was also seen in the emergency room on 04/17/2024 for chest pain and palpitations. On September 27th, she experienced diffuse chest pain lasting 30 to 40 minutes. She felt unwell afterward but continued her daily activities. An x-ray was performed, and her losartan  dosage was increased. Since then, she has experienced occasional palpitations described as 'skipping heartbeats' lasting a few seconds.  Chest discomfort lasting several days and constant.  She also occasionally has noticed left shoulder pain and back pain but no clear-cut radiation of the pain.  No limitation in physical activity in spite of the discomfort.  Over the past 2 days she has started to feel better and today she is essentially asymptomatic.  She reduced the dose of amlodipine  to 5 mg daily as at 1 time she noticed her blood pressure was soft in fact near syncopal recently.  But she has noticed her blood pressure usually is >140 mmHg.  Cardiac Studies relevent.    NA   Discussed the use of AI scribe software for clinical note transcription with the patient, who gave verbal consent to proceed.  History of Present Illness Brittany Lawson is a 68 year old female with hypertension who presents with chest pain and palpitations. She is accompanied by her husband, AC. She was referred by her doctor after experiencing chest pain.  On September 27th, she experienced diffuse chest pain lasting 30 to 40 minutes. She felt unwell afterward but continued her daily activities. An x-ray was performed, and her losartan  dosage was increased. Since then, she has experienced occasional palpitations  described as 'skipping heartbeats' lasting a few seconds.   She experienced blurred vision and near syncope once, with a blood pressure reading of 120/50 mmHg. She reduced her amlodipine  from 10 mg to 5 mg due to dizziness and feeling woozy.  She reports intermittent shoulder and back pain, sometimes concurrent with chest pain, lasting a day or two. These episodes are not affected by position or activities. Her current medications include amlodipine  5 mg in the morning, losartan  100 mg in the morning, and metoprolol succinate 25 mg at night. She has been taking pravastatin  for about three years.  Labs   Lab Results  Component Value Date   CHOL 195 03/10/2024   HDL 87 03/10/2024   LDLCALC 85 03/10/2024   TRIG 134 03/10/2024   CHOLHDL 2.2 03/10/2024   No results found for: LIPOA  Recent Labs    09/11/23 0826 03/10/24 1140 04/17/24 1228  NA 144 142 141  K 4.4 4.3 3.5  CL 106 104 103  CO2 25 19* 21*  GLUCOSE 84 86 94  BUN 10 14 10   CREATININE 0.90 0.82 0.83  CALCIUM 9.3 9.5 9.1  GFRNONAA  --   --  >60    Lab Results  Component Value Date   ALT 16 03/10/2024   AST 18 03/10/2024   ALKPHOS 71 03/10/2024   BILITOT 0.2 03/10/2024      Latest Ref Rng & Units 04/17/2024   12:28 PM 03/10/2024   11:40 AM 09/11/2023    8:26 AM  CBC  WBC 4.0 - 10.5 K/uL 9.7  7.4  8.1   Hemoglobin 12.0 - 15.0 g/dL 85.9  86.8  87.5   Hematocrit 36.0 - 46.0 % 43.9  41.3  37.7   Platelets 150 - 400 K/uL 270  253  268    Lab Results  Component Value Date   HGBA1C 6.0 (H) 03/10/2024    Lab Results  Component Value Date   TSH 3.110 03/10/2024     ROS  Review of Systems  Cardiovascular:  Positive for chest pain and palpitations. Negative for dyspnea on exertion and leg swelling.   Physical Exam:   VS:  BP 127/66 (BP Location: Left Arm, Patient Position: Sitting, Cuff Size: Large)   Pulse 60   Resp 16   Ht 5' 5 (1.651 m)   Wt 180 lb (81.6 kg)   SpO2 98%   BMI 29.95 kg/m    Wt Readings  from Last 3 Encounters:  05/05/24 180 lb (81.6 kg)  04/30/24 185 lb 3.2 oz (84 kg)  04/17/24 179 lb (81.2 kg)    BP Readings from Last 3 Encounters:  05/05/24 127/66  04/30/24 (!) 150/68  04/17/24 (!) 154/70   Physical Exam Neck:     Vascular: No carotid bruit or JVD.  Cardiovascular:     Rate and Rhythm: Normal rate and regular rhythm.     Pulses: Intact distal pulses.     Heart sounds: Normal heart sounds. No murmur heard.    No gallop.  Pulmonary:     Effort: Pulmonary effort is normal.     Breath sounds: Normal breath sounds.  Abdominal:     General: Bowel sounds are normal.     Palpations: Abdomen is soft.  Musculoskeletal:     Right lower leg: No edema.     Left lower leg: No edema.    EKG:    EKG Interpretation Date/Time:  Monday May 05 2024 14:16:52 EDT Ventricular Rate:  57 PR Interval:  116 QRS Duration:  94 QT Interval:  432 QTC Calculation: 420 R Axis:   -36  Text Interpretation: EKG 05/05/2024: Normal sinus rhythm at the rate of 57 bpm, left anterior fascicular block.  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  No significant change from 04/17/2024. Confirmed by Mattis Featherly, Jagadeesh ([id number]) on 05/05/2024 2:24:21 PM    ASSESSMENT AND PLAN: .      ICD-10-CM   1. Palpitations  R00.2 EKG 12-Lead    2. Chest pain, musculoskeletal  R07.89     3. Primary hypertension  I10     4. Hypercholesteremia  E78.00 atorvastatin (LIPITOR) 10 MG tablet     Assessment & Plan Palpitations and musculoskeletal chest pain Intermittent palpitations and musculoskeletal chest pain since April 12, 2024. Palpitations described as occasional flutters lasting a few seconds, possibly due to premature atrial or ventricular complexes. Musculoskeletal chest pain is localized, intermittent, and not consistent with cardiac ischemia. Differential includes stress, uncontrolled hypertension, sleep disturbances, dietary factors, or idiopathic causes. No evidence of acute coronary  syndrome based on previous emergency department evaluation and negative EKG and blood work. - Monitor symptoms and report if palpitations last more than 30 minutes or if chest pain becomes persistent and diffuse. - Educate on differentiating musculoskeletal pain from cardiac pain by palpation and activity correlation. - Encourage weight loss of 10 pounds to help with palpitations and blood pressure control. - Encourage regular physical activity to stress the heart and monitor for symptoms. - She continues to remain active taking care of her grandkids and has no  limitation in climbing flights of stairs with no change in chest discomfort or dyspnea.  Essential hypertension Hypertension with recent episodes of elevated blood pressure despite medication adjustments. Current regimen includes losartan  100 mg in the morning, amlodipine  5 mg in the morning, and metoprolol succinate 25 mg at night. Recent dizziness and hypotension episodes after increasing amlodipine  to 10 mg, leading to reduction back to 5 mg. Blood pressure variability noted, with recent readings of 120/50 and 150/60. - Change amlodipine  dosing to 10 mg at night with metoprolol to minimize daytime hypotension. - Educate on managing hypotensive episodes with increased fluid and salt intake and withholding medication if blood pressure is low. - Maintain a list of current medications and carry it at all times.  Hypercholesterolemia Hypercholesterolemia managed with pravastatin , with LDL goal of less than 70 mg/dL. Current LDL is 84 mg/dL. No adverse effects reported from pravastatin , though she notes hair thinning possibly related to medication. - Discontinue pravastatin  due to hair thinning, switch her to Lipitor 10 mg daily. - Monitor cholesterol levels and adjust treatment as needed.  Follow up: As needed.  Patient and her husband are aware to contact me if symptoms get worse or if they are persistent.  Signed,  Gordy Bergamo, MD,  [facility] 05/05/2024, 5:48 PM [facility] [address] [place], [place] [address] Phone: [phone]. Fax:  [phone]

## 2024-05-28 ENCOUNTER — Ambulatory Visit: Payer: Self-pay | Admitting: Gastroenterology

## 2024-06-03 ENCOUNTER — Encounter: Payer: Self-pay | Admitting: Physician Assistant

## 2024-06-03 ENCOUNTER — Ambulatory Visit (INDEPENDENT_AMBULATORY_CARE_PROVIDER_SITE_OTHER): Admitting: Physician Assistant

## 2024-06-03 VITALS — BP 142/74 | HR 77 | Temp 97.8°F | Ht 65.0 in | Wt 184.0 lb

## 2024-06-03 DIAGNOSIS — I10 Essential (primary) hypertension: Secondary | ICD-10-CM

## 2024-06-03 DIAGNOSIS — R0789 Other chest pain: Secondary | ICD-10-CM | POA: Diagnosis not present

## 2024-06-03 DIAGNOSIS — R002 Palpitations: Secondary | ICD-10-CM | POA: Diagnosis not present

## 2024-06-03 DIAGNOSIS — E782 Mixed hyperlipidemia: Secondary | ICD-10-CM

## 2024-06-03 MED ORDER — METOPROLOL SUCCINATE ER 50 MG PO TB24: 50.0000 mg | ORAL_TABLET | Freq: Every day | ORAL | 3 refills | Status: AC

## 2024-06-03 NOTE — Progress Notes (Signed)
 Subjective:  Patient ID: Brittany Lawson, female    DOB: 1956-01-31  Age: 68 y.o. MRN: [id number]  Chief Complaint  Patient presents with   Chest pain and palpitations    HPI Follow up Hospitalization  Patient was admitted to [facility] on 05/29/24 and discharged on 05/30/24. She was treated for chest pain and palpitations. Treatment for this included stress echocardiogram / labwork. Telephone follow up was done on 06/01/24 She reports excellent compliance with treatment. She reports this condition is about the same - currently with no symptoms except bp and pulse still elevated with average 150-180/80-90 with pulse up to 120.  Stress echo was normal and patient has been referred to cardiologist Dr Launie with appt tomorrow for further evaluation Patient has had symptoms for several weeks and was referred to cardiologist - Dr Ladona- and had appt 05/05/24 - pt feels she was 'dismissed' and no treatment or testing was ordered for patient for her symptoms and was told 'she would be fine if she would just lose weight'    04/30/2024    2:05 PM 01/10/2024    2:02 PM 09/10/2023    2:26 PM 05/25/2023    9:30 AM 02/21/2023    8:53 AM  Depression screen PHQ 2/9  Decreased Interest 0 1 1 1  0  Down, Depressed, Hopeless 0 2 2 1  0  PHQ - 2 Score 0 3 3 2  0  Altered sleeping  2 2 1  0  Tired, decreased energy  2 2 1  0  Change in appetite  0 0 1 0  Feeling bad or failure about yourself   0 0 1 0  Trouble concentrating  0 0 0 0  Moving slowly or fidgety/restless  0 0 0 0  Suicidal thoughts   0 0 0  PHQ-9 Score  7  7  6   0   Difficult doing work/chores    Not difficult at all Not difficult at all     Data saved with a previous flowsheet row definition        08/30/2023    3:52 PM 09/10/2023    2:26 PM 01/10/2024    2:03 PM 03/10/2024   11:23 AM 04/30/2024    2:05 PM  Fall Risk  Falls in the past year? 0 0 0 0 0  Was there an injury with Fall? 0 0 0 0 0  Fall Risk Category Calculator 0 0 0  0 0  Patient at Risk for Falls Due to No Fall Risks No Fall Risks No Fall Risks No Fall Risks No Fall Risks  Fall risk Follow up  Falls evaluation completed Falls prevention discussed;Education provided;Falls evaluation completed Falls evaluation completed Falls evaluation completed     ROS CONSTITUTIONAL: has had some malaise E/N/T: Negative for ear pain, nasal congestion and sore throat.  CARDIOVASCULAR: see HPI RESPIRATORY: Negative for recent cough and dyspnea.  GASTROINTESTINAL: Negative for abdominal pain, acid reflux symptoms, constipation, diarrhea, nausea and vomiting.  PSYCHIATRIC: Negative for sleep disturbance and to question depression screen.  Negative for depression, negative for anhedonia.    Current Outpatient Medications:    albuterol  (VENTOLIN  HFA) 108 (90 Base) MCG/ACT inhaler, INHALE 1 - 2 PUFFS EVERY 4 HOURS AS NEEDED, Disp: 8.5 g, Rfl: 0   amLODipine  (NORVASC ) 10 MG tablet, Take 10 mg by mouth every evening., Disp: , Rfl:    atorvastatin (LIPITOR) 10 MG tablet, Take 1 tablet (10 mg total) by mouth daily., Disp: 90 tablet, Rfl: 0  desonide  (DESOWEN ) 0.05 % cream, Apply topically 2 (two) times daily., Disp: 30 g, Rfl: 0   fluticasone -salmeterol (ADVAIR) 250-50 MCG/ACT AEPB, INHALE 1 PUFF INTO THE LUNGS IN THE MORNING AND AT BEDTIME., Disp: 60 each, Rfl: 2   losartan  (COZAAR ) 100 MG tablet, TAKE 1 TABLET BY MOUTH AT BEDTIME., Disp: 90 tablet, Rfl: 0   meclizine (ANTIVERT) 25 MG tablet, Take 25 mg by mouth 3 (three) times daily as needed for dizziness., Disp: , Rfl:    metoprolol succinate (TOPROL-XL) 50 MG 24 hr tablet, Take 1 tablet (50 mg total) by mouth daily. Take with or immediately following a meal., Disp: 30 tablet, Rfl: 3   montelukast  (SINGULAIR ) 10 MG tablet, TAKE 1 TABLET BY MOUTH DAILY, Disp: 90 tablet, Rfl: 0   olopatadine  (PATANOL) 0.1 % ophthalmic solution, Place 1 drop into the left eye 2 (two) times daily., Disp: 5 mL, Rfl: 12   pantoprazole  (PROTONIX )  40 MG tablet, TAKE 1 TABLET BY MOUTH TWICE DAILY, Disp: 180 tablet, Rfl: 0   traZODone  (DESYREL ) 50 MG tablet, TAKE 1 TABLET BY MOUTH AT BEDTIME, Disp: 90 tablet, Rfl: 0   venlafaxine  XR (EFFEXOR -XR) 75 MG 24 hr capsule, TAKE 1 CAPSULE BY MOUTH TWO TIMES DAILY., Disp: 180 capsule, Rfl: 0  Past Medical History:  Diagnosis Date   Acute laryngopharyngitis 08/20/2019   Allergic conjunctivitis of left eye 01/08/2024   Allergy 1960   Anxiety 1980   Sometimes   Asthma    Asthma in adult, mild persistent, with acute exacerbation 08/30/2023   Asthma in adult, unspecified asthma severity, with acute exacerbation 08/27/2023   Atopic dermatitis of eyelid, left 01/08/2024   Bilateral impacted cerumen 05/04/2023   Bilateral sensorineural hearing loss 06/06/2023   Blood clot in vein yrs ago   x 2 right leg   Body mass index (BMI) 30.0-30.9, adult 05/25/2023   Colon polyps 2019   Constipation    Depression    Dermatitis 03/23/2023   Essential hypertension 12/08/2019   Gastroesophageal reflux disease without esophagitis 08/20/2019   GERD (gastroesophageal reflux disease)    History of adenomatous polyp of colon 11/12/2019   History of kidney stones 2020/04/15   passed on own   Hypertension    Left ovarian cyst    Mild intermittent asthma without complication 02/06/2022   Mixed hyperlipidemia 02/06/2022   Prediabetes 09/10/2023   Varicose veins of both lower extremities    Vitamin D  insufficiency 02/06/2022   Objective:  PHYSICAL EXAM:   BP (!) 142/74 (BP Location: Right Arm, Patient Position: Sitting)   Pulse 77   Temp 97.8 F (36.6 C) (Temporal)   Ht 5' 5 (1.651 m)   Wt 184 lb (83.5 kg)   SpO2 99%   BMI 30.62 kg/m     GEN: Well nourished, well developed, in no acute distress  Cardiac: RRR; no murmurs, rubs, or gallops,no edema -  Respiratory:  normal respiratory rate and pattern with no distress - normal breath sounds with no rales, rhonchi, wheezes or rubs MS: no deformity or  atrophy  Skin: warm and dry, no rash  Neuro:  Alert and Oriented x 3, - CN II-Xii grossly intact Psych: euthymic mood, appropriate affect and demeanor  Assessment & Plan:    Essential hypertension -     Metoprolol Succinate ER; Take 1 tablet (50 mg total) by mouth daily. Take with or immediately following a meal.  Dispense: 30 tablet; Refill: 3 Recheck bp 1 month  Palpitations -  Thyroid Panel With TSH -     Metoprolol Succinate ER; Take 1 tablet (50 mg total) by mouth daily. Take with or immediately following a meal.  Dispense: 30 tablet; Refill: 3 See cardiology as scheduled tomorrow - need for Zio monitoring Other chest pain -     CBC with Differential/Platelet -     Comprehensive metabolic panel with GFR See cardiology as scheduled tomorrow --- further cardiac testing recommended  Hyperlipidemia Continue lipitor Lipid panel pending  Follow-up: Return in about 3 months (around 09/03/2024) for chronic fasting follow-up.  An After Visit Summary was printed and given to the patient.  CAMIE JONELLE NICHOLAUS DEVONNA [facility] [phone]

## 2024-06-04 ENCOUNTER — Ambulatory Visit: Payer: Self-pay | Admitting: Physician Assistant

## 2024-06-04 LAB — COMPREHENSIVE METABOLIC PANEL WITH GFR
ALT: 19 IU/L (ref 0–32)
AST: 24 IU/L (ref 0–40)
Albumin: 4.5 g/dL (ref 3.9–4.9)
Alkaline Phosphatase: 83 IU/L (ref 49–135)
BUN/Creatinine Ratio: 14 (ref 12–28)
BUN: 12 mg/dL (ref 8–27)
Bilirubin Total: 0.3 mg/dL (ref 0.0–1.2)
CO2: 25 mmol/L (ref 20–29)
Calcium: 9.9 mg/dL (ref 8.7–10.3)
Chloride: 102 mmol/L (ref 96–106)
Creatinine, Ser: 0.84 mg/dL (ref 0.57–1.00)
Globulin, Total: 3.2 g/dL (ref 1.5–4.5)
Glucose: 106 mg/dL — ABNORMAL HIGH (ref 70–99)
Potassium: 4.4 mmol/L (ref 3.5–5.2)
Sodium: 141 mmol/L (ref 134–144)
Total Protein: 7.7 g/dL (ref 6.0–8.5)
eGFR: 75 mL/min/1.73 (ref >59)

## 2024-06-04 LAB — CBC WITH DIFFERENTIAL/PLATELET
Basophils Absolute: 0.1 x10E3/uL (ref 0.0–0.2)
Basos: 1 %
EOS (ABSOLUTE): 0.4 x10E3/uL (ref 0.0–0.4)
Eos: 5 %
Hematocrit: 44.5 % (ref 34.0–46.6)
Hemoglobin: 14.2 g/dL (ref 11.1–15.9)
Immature Grans (Abs): 0 x10E3/uL (ref 0.0–0.1)
Immature Granulocytes: 0 %
Lymphocytes Absolute: 2.6 x10E3/uL (ref 0.7–3.1)
Lymphs: 30 %
MCH: 27.1 pg (ref 26.6–33.0)
MCHC: 31.9 g/dL (ref 31.5–35.7)
MCV: 85 fL (ref 79–97)
Monocytes Absolute: 1 x10E3/uL — ABNORMAL HIGH (ref 0.1–0.9)
Monocytes: 12 %
Neutrophils Absolute: 4.6 x10E3/uL (ref 1.4–7.0)
Neutrophils: 52 %
Platelets: 285 x10E3/uL (ref 150–450)
RBC: 5.24 x10E6/uL (ref 3.77–5.28)
RDW: 12.8 % (ref 11.7–15.4)
WBC: 8.7 x10E3/uL (ref 3.4–10.8)

## 2024-06-04 LAB — LIPID PANEL
Chol/HDL Ratio: 2.1 ratio (ref 0.0–4.4)
Cholesterol, Total: 177 mg/dL (ref 100–199)
HDL: 84 mg/dL (ref >39)
LDL Chol Calc (NIH): 67 mg/dL (ref 0–99)
Triglycerides: 155 mg/dL — ABNORMAL HIGH (ref 0–149)
VLDL Cholesterol Cal: 26 mg/dL (ref 5–40)

## 2024-06-04 LAB — THYROID PANEL WITH TSH
Free Thyroxine Index: 2 (ref 1.2–4.9)
T3 Uptake Ratio: 23 % — ABNORMAL LOW (ref 24–39)
T4, Total: 8.6 ug/dL (ref 4.5–12.0)
TSH: 1.99 u[IU]/mL (ref 0.450–4.500)

## 2024-06-16 ENCOUNTER — Other Ambulatory Visit: Payer: Self-pay | Admitting: Physician Assistant

## 2024-06-16 DIAGNOSIS — I10 Essential (primary) hypertension: Secondary | ICD-10-CM

## 2024-06-16 DIAGNOSIS — F419 Anxiety disorder, unspecified: Secondary | ICD-10-CM

## 2024-06-16 DIAGNOSIS — K219 Gastro-esophageal reflux disease without esophagitis: Secondary | ICD-10-CM

## 2024-07-01 ENCOUNTER — Other Ambulatory Visit: Payer: Self-pay | Admitting: Physician Assistant

## 2024-07-01 DIAGNOSIS — I10 Essential (primary) hypertension: Secondary | ICD-10-CM

## 2024-07-01 DIAGNOSIS — E7800 Pure hypercholesterolemia, unspecified: Secondary | ICD-10-CM

## 2024-07-01 MED ORDER — AMLODIPINE BESYLATE 10 MG PO TABS: 10.0000 mg | ORAL_TABLET | Freq: Every evening | ORAL | 1 refills | Status: AC

## 2024-07-01 MED ORDER — ATORVASTATIN CALCIUM 10 MG PO TABS: 10.0000 mg | ORAL_TABLET | Freq: Every day | ORAL | 0 refills | Status: AC

## 2024-07-08 ENCOUNTER — Ambulatory Visit

## 2024-07-08 NOTE — Progress Notes (Signed)
 Patient is in office today for a nurse visit for Blood Pressure Check. Patient blood pressure was 132/70, Patient No chest pain, No shortness of breath, No dyspnea on exertion, No orthopnea, No paroxysmal nocturnal dyspnea, No edema, No palpitations, No syncope.   Spoke with Ginnie, she states she is doing well and is good to go.   Spoke with patient, verbalized understanding and had no questions at this time.

## 2024-08-12 ENCOUNTER — Ambulatory Visit: Admitting: Physician Assistant

## 2024-08-13 ENCOUNTER — Ambulatory Visit: Admitting: Physician Assistant

## 2024-09-09 ENCOUNTER — Ambulatory Visit: Admitting: Physician Assistant

## 2024-09-16 ENCOUNTER — Ambulatory Visit: Admitting: Physician Assistant

## 2025-01-15 ENCOUNTER — Ambulatory Visit
